# Patient Record
Sex: Male | Born: 1975 | Race: White | Hispanic: No | Marital: Married | Smoking: Never smoker
Health system: Southern US, Community
[De-identification: ages and names within clinical notes are randomized; demographics above are authoritative.]

## PROBLEM LIST (undated history)

## (undated) DIAGNOSIS — F429 Obsessive-compulsive disorder, unspecified: Secondary | ICD-10-CM

## (undated) DIAGNOSIS — F32A Depression, unspecified: Secondary | ICD-10-CM

## (undated) DIAGNOSIS — F329 Major depressive disorder, single episode, unspecified: Secondary | ICD-10-CM

## (undated) HISTORY — PX: CERVICAL FUSION: SHX112

---

## 2011-04-23 DIAGNOSIS — IMO0001 Reserved for inherently not codable concepts without codable children: Secondary | ICD-10-CM | POA: Insufficient documentation

## 2011-04-23 DIAGNOSIS — F411 Generalized anxiety disorder: Secondary | ICD-10-CM | POA: Insufficient documentation

## 2011-04-23 DIAGNOSIS — F339 Major depressive disorder, recurrent, unspecified: Secondary | ICD-10-CM | POA: Insufficient documentation

## 2014-01-14 ENCOUNTER — Emergency Department (HOSPITAL_COMMUNITY)
Admission: EM | Admit: 2014-01-14 | Discharge: 2014-01-14 | Disposition: A | Discharge status: Home / Self Care | Payer: BC Managed Care – PPO | Attending: Emergency Medicine | Admitting: Emergency Medicine

## 2014-01-14 ENCOUNTER — Encounter (HOSPITAL_COMMUNITY): Payer: Self-pay | Admitting: Emergency Medicine

## 2014-01-14 DIAGNOSIS — R112 Nausea with vomiting, unspecified: Secondary | ICD-10-CM

## 2014-01-14 DIAGNOSIS — F329 Major depressive disorder, single episode, unspecified: Secondary | ICD-10-CM | POA: Insufficient documentation

## 2014-01-14 DIAGNOSIS — A088 Other specified intestinal infections: Secondary | ICD-10-CM | POA: Insufficient documentation

## 2014-01-14 DIAGNOSIS — A084 Viral intestinal infection, unspecified: Secondary | ICD-10-CM

## 2014-01-14 DIAGNOSIS — R197 Diarrhea, unspecified: Secondary | ICD-10-CM

## 2014-01-14 DIAGNOSIS — F429 Obsessive-compulsive disorder, unspecified: Secondary | ICD-10-CM | POA: Insufficient documentation

## 2014-01-14 DIAGNOSIS — F3289 Other specified depressive episodes: Secondary | ICD-10-CM | POA: Insufficient documentation

## 2014-01-14 DIAGNOSIS — Z79899 Other long term (current) drug therapy: Secondary | ICD-10-CM | POA: Insufficient documentation

## 2014-01-14 DIAGNOSIS — R61 Generalized hyperhidrosis: Secondary | ICD-10-CM | POA: Insufficient documentation

## 2014-01-14 HISTORY — DX: Major depressive disorder, single episode, unspecified: F32.9

## 2014-01-14 HISTORY — DX: Obsessive-compulsive disorder, unspecified: F42.9

## 2014-01-14 HISTORY — DX: Depression, unspecified: F32.A

## 2014-01-14 LAB — COMPREHENSIVE METABOLIC PANEL
ALBUMIN: 4 g/dL (ref 3.5–5.2)
ALT: 43 U/L (ref 0–53)
AST: 27 U/L (ref 0–37)
Alkaline Phosphatase: 59 U/L (ref 39–117)
BUN: 18 mg/dL (ref 6–23)
CO2: 24 mEq/L (ref 19–32)
CREATININE: 0.89 mg/dL (ref 0.50–1.35)
Calcium: 9 mg/dL (ref 8.4–10.5)
Chloride: 102 mEq/L (ref 96–112)
GFR calc Af Amer: >90 mL/min (ref >90)
GFR calc non Af Amer: >90 mL/min (ref >90)
Glucose, Bld: 117 mg/dL — ABNORMAL HIGH (ref 70–99)
Potassium: 3.9 mEq/L (ref 3.7–5.3)
Sodium: 141 mEq/L (ref 137–147)
TOTAL PROTEIN: 7.3 g/dL (ref 6.0–8.3)
Total Bilirubin: 0.5 mg/dL (ref 0.3–1.2)

## 2014-01-14 LAB — LIPASE, BLOOD: Lipase: 13 U/L (ref 11–59)

## 2014-01-14 LAB — CBC WITH DIFFERENTIAL/PLATELET
BASOS ABS: 0 10*3/uL (ref 0.0–0.1)
BASOS PCT: 0 % (ref 0–1)
Eosinophils Absolute: 0.2 10*3/uL (ref 0.0–0.7)
Eosinophils Relative: 3 % (ref 0–5)
HEMATOCRIT: 46.7 % (ref 39.0–52.0)
Hemoglobin: 16.2 g/dL (ref 13.0–17.0)
Lymphocytes Relative: 20 % (ref 12–46)
Lymphs Abs: 1.1 10*3/uL (ref 0.7–4.0)
MCH: 31.2 pg (ref 26.0–34.0)
MCHC: 34.7 g/dL (ref 30.0–36.0)
MCV: 90 fL (ref 78.0–100.0)
MONO ABS: 0.5 10*3/uL (ref 0.1–1.0)
Monocytes Relative: 8 % (ref 3–12)
Neutro Abs: 4 10*3/uL (ref 1.7–7.7)
Neutrophils Relative %: 69 % (ref 43–77)
Platelets: 234 10*3/uL (ref 150–400)
RBC: 5.19 MIL/uL (ref 4.22–5.81)
RDW: 12.1 % (ref 11.5–15.5)
WBC: 5.7 10*3/uL (ref 4.0–10.5)

## 2014-01-14 LAB — POC OCCULT BLOOD, ED: Fecal Occult Bld: NEGATIVE

## 2014-01-14 LAB — CLOSTRIDIUM DIFFICILE BY PCR: Toxigenic C. Difficile by PCR: NEGATIVE

## 2014-01-14 MED ORDER — SODIUM CHLORIDE 0.9 % IV BOLUS (SEPSIS)
1000.0000 mL | Freq: Once | INTRAVENOUS | Status: AC
  Administered 2014-01-14: 1000 mL via INTRAVENOUS

## 2014-01-14 MED ORDER — ONDANSETRON 8 MG PO TBDP: ORAL_TABLET | ORAL | Status: AC

## 2014-01-14 MED ORDER — ONDANSETRON HCL 4 MG/2ML IJ SOLN: 4.0000 mg | Freq: Once | INTRAMUSCULAR | Status: DC

## 2014-01-14 NOTE — ED Provider Notes (Signed)
CSN: [id number]     Arrival date & time 01/14/14  0409 History   First MD Initiated Contact with Patient 01/14/14 0431     Chief Complaint  Patient presents with  . Emesis  . Diarrhea   HPI  History provided by the patient and significant other. Patient is a 38 year old male who presents with symptoms of nausea, vomiting and diarrhea. Patient first began feeling some abdominal discomfort and bloating Sunday evening. On Monday morning he began having episodes of vomiting with watery diarrhea throughout the day. Symptoms gradually improved and by Tuesday she was feeling somewhat better. He did have a few occasional loose stools. Wednesday patient began having additional episodes of nausea vomiting and diarrhea. This continued into the evening and early this morning. He has had some increased weakness and fatigue. He is able to keep down some water and simple fluids. He denies any known sick contacts. Did recently travel to [place] but no other travel. Denies any blood in stool. No other aggravating or alleviating factors. No other associated symptoms.     Past Medical History  Diagnosis Date  . Depression   . OCD (obsessive compulsive disorder)    No past surgical history on file. No family history on file. History  Substance Use Topics  . Smoking status: Not on file  . Smokeless tobacco: Not on file  . Alcohol Use: Not on file    Review of Systems  Constitutional: Positive for diaphoresis. Negative for fever.  HENT: Negative for congestion.   Respiratory: Negative for cough and shortness of breath.   Gastrointestinal: Positive for nausea, vomiting and diarrhea. Negative for abdominal pain and blood in stool.  All other systems reviewed and are negative.     Allergies  Review of patient's allergies indicates no known allergies.  Home Medications   Current Outpatient Rx  Name  Route  Sig  Dispense  Refill  . escitalopram (LEXAPRO) 20 MG tablet   Oral   Take 60 mg by mouth  daily.         Marland Kitchen ibuprofen (ADVIL,MOTRIN) 200 MG tablet   Oral   Take 200 mg by mouth every 6 (six) hours as needed for mild pain.         Marland Kitchen LORazepam (ATIVAN) 0.5 MG tablet   Oral   Take 0.5 mg by mouth every 8 (eight) hours as needed for anxiety.         . Multiple Vitamin (MULTIVITAMIN WITH MINERALS) TABS tablet   Oral   Take 1 tablet by mouth daily.          BP 132/88  Pulse 93  Temp(Src) 98.1 F (36.7 C) (Oral)  Resp 18  SpO2 98% Physical Exam  Nursing note and vitals reviewed. Constitutional: He is oriented to person, place, and time. He appears well-developed and well-nourished. No distress.  HENT:  Head: Normocephalic.  Cardiovascular: Normal rate and regular rhythm.   Pulmonary/Chest: Effort normal and breath sounds normal. No respiratory distress.  Abdominal: Soft. Bowel sounds are normal. He exhibits no distension. There is no tenderness. There is no rebound and no guarding.  Neurological: He is alert and oriented to person, place, and time.  Skin: Skin is warm.  Psychiatric: He has a normal mood and affect. His behavior is normal.    ED Course  Procedures   COORDINATION OF CARE:  Nursing notes reviewed. Vital signs reviewed. Initial pt interview and examination performed.   Filed Vitals:   01/14/14 0419  BP: 132/88  Pulse: 93  Temp: 98.1 F (36.7 C)  TempSrc: Oral  Resp: 18  SpO2: 98%    4:30 AM patient seen and evaluated. Patient well-appearing in no acute distress. Afebrile. Does not appear severely dehydrated. Symptoms most consistent with viral GI process. Discussed work up plan including basic lab testing and patient agrees.   Lab testing unremarkable. Patient did have episodes of diarrhea. Stool collected and sent for further testing. Patient without any recent antibiotic use. No significant risk factors for C. difficile. At this time we'll recommend symptomatic treatment of his nausea vomiting and continued rest at home. Patient agrees  with plan.   Treatment plan initiated: Medications  ondansetron [facility]) injection 4 mg (not administered)  sodium chloride 0.9 % bolus 1,000 mL (0 mLs Intravenous Stopped 01/14/14 0530)    Results for orders placed during the hospital encounter of 01/14/14  CBC WITH DIFFERENTIAL      Result Value Ref Range   WBC 5.7  4.0 - 10.5 K/uL   RBC 5.19  4.22 - 5.81 MIL/uL   Hemoglobin 16.2  13.0 - 17.0 g/dL   HCT 46.7  39.0 - 52.0 %   MCV 90.0  78.0 - 100.0 fL   MCH 31.2  26.0 - 34.0 pg   MCHC 34.7  30.0 - 36.0 g/dL   RDW 12.1  11.5 - 15.5 %   Platelets 234  150 - 400 K/uL   Neutrophils Relative % 69  43 - 77 %   Neutro Abs 4.0  1.7 - 7.7 K/uL   Lymphocytes Relative 20  12 - 46 %   Lymphs Abs 1.1  0.7 - 4.0 K/uL   Monocytes Relative 8  3 - 12 %   Monocytes Absolute 0.5  0.1 - 1.0 K/uL   Eosinophils Relative 3  0 - 5 %   Eosinophils Absolute 0.2  0.0 - 0.7 K/uL   Basophils Relative 0  0 - 1 %   Basophils Absolute 0.0  0.0 - 0.1 K/uL  COMPREHENSIVE METABOLIC PANEL      Result Value Ref Range   Sodium 141  137 - 147 mEq/L   Potassium 3.9  3.7 - 5.3 mEq/L   Chloride 102  96 - 112 mEq/L   CO2 24  19 - 32 mEq/L   Glucose, Bld 117 (*) 70 - 99 mg/dL   BUN 18  6 - 23 mg/dL   Creatinine, Ser 0.89  0.50 - 1.35 mg/dL   Calcium 9.0  8.4 - 10.5 mg/dL   Total Protein 7.3  6.0 - 8.3 g/dL   Albumin 4.0  3.5 - 5.2 g/dL   AST 27  0 - 37 U/L   ALT 43  0 - 53 U/L   Alkaline Phosphatase 59  39 - 117 U/L   Total Bilirubin 0.5  0.3 - 1.2 mg/dL   GFR calc non Af Amer >90  >90 mL/min   GFR calc Af Amer >90  >90 mL/min  LIPASE, BLOOD      Result Value Ref Range   Lipase 13  11 - 59 U/L      MDM   Final diagnoses:  Viral gastroenteritis  Nausea vomiting and diarrhea        Martie Lee, PA-C 01/14/14 [phone]

## 2014-01-14 NOTE — Discharge Instructions (Signed)
Your lab testing today has not shown any signs for concerning or emergent cause of your symptoms. Continue to drink plenty of fluids to stay hydrated. Followup with a primary care provider for continued evaluation and treatment.    Viral Gastroenteritis Viral gastroenteritis is also known as stomach flu. This condition affects the stomach and intestinal tract. It can cause sudden diarrhea and vomiting. The illness typically lasts 3 to 8 days. Most people develop an immune response that eventually gets rid of the virus. While this natural response develops, the virus can make you quite ill. CAUSES  Many different viruses can cause gastroenteritis, such as rotavirus or noroviruses. You can catch one of these viruses by consuming contaminated food or water. You may also catch a virus by sharing utensils or other personal items with an infected person or by touching a contaminated surface. SYMPTOMS  The most common symptoms are diarrhea and vomiting. These problems can cause a severe loss of body fluids (dehydration) and a body salt (electrolyte) imbalance. Other symptoms may include:  Fever.  Headache.  Fatigue.  Abdominal pain. DIAGNOSIS  Your caregiver can usually diagnose viral gastroenteritis based on your symptoms and a physical exam. A stool sample may also be taken to test for the presence of viruses or other infections. TREATMENT  This illness typically goes away on its own. Treatments are aimed at rehydration. The most serious cases of viral gastroenteritis involve vomiting so severely that you are not able to keep fluids down. In these cases, fluids must be given through an intravenous line (IV). HOME CARE INSTRUCTIONS   Drink enough fluids to keep your urine clear or pale yellow. Drink small amounts of fluids frequently and increase the amounts as tolerated.  Ask your caregiver for specific rehydration instructions.  Avoid:  Foods high in sugar.  Alcohol.  Carbonated  drinks.  Tobacco.  Juice.  Caffeine drinks.  Extremely hot or cold fluids.  Fatty, greasy foods.  Too much intake of anything at one time.  Dairy products until 24 to 48 hours after diarrhea stops.  You may consume probiotics. Probiotics are active cultures of beneficial bacteria. They may lessen the amount and number of diarrheal stools in adults. Probiotics can be found in yogurt with active cultures and in supplements.  Wash your hands well to avoid spreading the virus.  Only take over-the-counter or prescription medicines for pain, discomfort, or fever as directed by your caregiver. Do not give aspirin to children. Antidiarrheal medicines are not recommended.  Ask your caregiver if you should continue to take your regular prescribed and over-the-counter medicines.  Keep all follow-up appointments as directed by your caregiver. SEEK IMMEDIATE MEDICAL CARE IF:   You are unable to keep fluids down.  You do not urinate at least once every 6 to 8 hours.  You develop shortness of breath.  You notice blood in your stool or vomit. This may look like coffee grounds.  You have abdominal pain that increases or is concentrated in one small area (localized).  You have persistent vomiting or diarrhea.  You have a fever.  The patient is a child younger than 3 months, and he or she has a fever.  The patient is a child older than 3 months, and he or she has a fever and persistent symptoms.  The patient is a child older than 3 months, and he or she has a fever and symptoms suddenly get worse.  The patient is a baby, and he or she has no  tears when crying. MAKE SURE YOU:   Understand these instructions.  Will watch your condition.  Will get help right away if you are not doing well or get worse. Document Released: 09/24/2005 Document Revised: 12/17/2011 Document Reviewed: 07/11/2011 [facility] Patient Information 2014 [place].

## 2014-01-14 NOTE — ED Provider Notes (Signed)
Medical screening examination/treatment/procedure(s) were performed by non-physician practitioner and as supervising physician I was immediately available for consultation/collaboration.   EKG Interpretation None       Kalman Drape, MD 01/14/14 [phone]

## 2014-01-14 NOTE — ED Notes (Signed)
Pt reports n/v/d onset 3-4 days, pt states n/v has subsided, diarrhea, yellow with odor about every 5 min for last few hours. Pt appears pale. Family at bedside. Zofran not given at this time, pt denies nausea. Pt and family to notify staff if he becomes nauseated.

## 2014-01-14 NOTE — ED Notes (Signed)
Pt reports that he started to feel bloated Sunday and began having n/v/d. The symptoms eased up but returned Tuesday with another onset of bloating. Pt reports vomiting 4times and numerous episodes of diarrhea. Pt denies any fevers. Pt alert and ambulatory. Family in room.

## 2014-01-14 NOTE — ED Notes (Signed)
Stool culture obtained, watery light brown, fowl odor

## 2014-01-18 LAB — STOOL CULTURE

## 2017-11-16 ENCOUNTER — Emergency Department (HOSPITAL_COMMUNITY)
Admission: EM | Admit: 2017-11-16 | Discharge: 2017-11-16 | Disposition: A | Discharge status: Home / Self Care | Payer: BLUE CROSS/BLUE SHIELD | Attending: Emergency Medicine | Admitting: Emergency Medicine

## 2017-11-16 ENCOUNTER — Emergency Department (HOSPITAL_COMMUNITY): Payer: BLUE CROSS/BLUE SHIELD

## 2017-11-16 ENCOUNTER — Other Ambulatory Visit: Payer: Self-pay

## 2017-11-16 ENCOUNTER — Encounter (HOSPITAL_COMMUNITY): Payer: Self-pay

## 2017-11-16 DIAGNOSIS — Y929 Unspecified place or not applicable: Secondary | ICD-10-CM | POA: Insufficient documentation

## 2017-11-16 DIAGNOSIS — Z79899 Other long term (current) drug therapy: Secondary | ICD-10-CM | POA: Diagnosis not present

## 2017-11-16 DIAGNOSIS — W228XXA Striking against or struck by other objects, initial encounter: Secondary | ICD-10-CM | POA: Diagnosis not present

## 2017-11-16 DIAGNOSIS — Y9389 Activity, other specified: Secondary | ICD-10-CM | POA: Insufficient documentation

## 2017-11-16 DIAGNOSIS — M7041 Prepatellar bursitis, right knee: Secondary | ICD-10-CM

## 2017-11-16 DIAGNOSIS — Y999 Unspecified external cause status: Secondary | ICD-10-CM | POA: Diagnosis not present

## 2017-11-16 DIAGNOSIS — S8011XA Contusion of right lower leg, initial encounter: Secondary | ICD-10-CM | POA: Insufficient documentation

## 2017-11-16 DIAGNOSIS — S8991XA Unspecified injury of right lower leg, initial encounter: Secondary | ICD-10-CM | POA: Diagnosis present

## 2017-11-16 NOTE — ED Triage Notes (Signed)
Pt presents to the ed with complaints of injuring his right leg. States he was cutting down a tree limb with part of the tree on the ground.  The tree somehow swung and hit his right thigh and knocked the pt up in the air about 2 feet and he then landed on his right side.  Pt was wearing a helmet denies any head injury or LOC.  Reports feeling anxious.  Ambulatory but states he has to limp due to pain in his right thigh. Alert and oriented.

## 2017-11-16 NOTE — ED Provider Notes (Signed)
[facility] EMERGENCY DEPARTMENT Provider Note   CSN: [id number] Arrival date & time: 11/16/17  1232     History   Chief Complaint Chief Complaint  Patient presents with  . Leg Injury    HPI Phillip Griffin is a 42 y.o. male.  HPI   42 year old male presents status post leg injury.  Patient notes that he was cutting a long limb off of a tree that was struck in the ground, as he was cutting limbs off the distal end of it the limb swelling out struck him in the right lateral thigh and threw him into the air.  Patient reports he was wearing a hard hat, denies any loss of consciousness or headache, denies any neurological deficits.  He reports some nausea.  He notes an achy pain to the right lateral thigh worse with movement, denies any other injuries including chest pain, shortness of breath, abdominal pain, neck or back pain.  She also notes chronic swelling over the right knee, he notes this is only on the front, surrounding swelling, redness, warmth to touch or fever, no decreased range of motion  Past Medical History:  Diagnosis Date  . Depression   . OCD (obsessive compulsive disorder)     There are no active problems to display for this patient.   Past Surgical History:  Procedure Laterality Date  . CERVICAL FUSION         Home Medications    Prior to Admission medications   Medication Sig Start Date End Date Taking? Authorizing Provider  escitalopram (LEXAPRO) 20 MG tablet Take 60 mg by mouth daily.    [provider]  ibuprofen (ADVIL,MOTRIN) 200 MG tablet Take 200 mg by mouth every 6 (six) hours as needed for mild pain.    [provider]  LORazepam (ATIVAN) 0.5 MG tablet Take 0.5 mg by mouth every 8 (eight) hours as needed for anxiety.    [provider]  Multiple Vitamin (MULTIVITAMIN WITH MINERALS) TABS tablet Take 1 tablet by mouth daily.    [provider]  ondansetron (ZOFRAN ODT) 8 MG  disintegrating tablet 25m ODT q4 hours prn nausea 01/14/14   DHazel Sams PA-C    Family History No family history on file.  Social History Social History   Tobacco Use  . Smoking status: Never Smoker  . Smokeless tobacco: Never Used  Substance Use Topics  . Alcohol use: Yes    Frequency: Never    Comment: weekly  . Drug use: No     Allergies   Patient has no known allergies.   Review of Systems Review of Systems  All other systems reviewed and are negative.    Physical Exam Updated Vital Signs BP (!) 131/93   Pulse 93   Temp 97.8 F (36.6 C)   Resp 18   Wt 90.3 kg (199 lb)   SpO2 99%   Physical Exam  Constitutional: He is oriented to person, place, and time. He appears well-developed and well-nourished.  HENT:  Head: Normocephalic and atraumatic.  Eyes: Conjunctivae are normal. Pupils are equal, round, and reactive to light. Right eye exhibits no discharge. Left eye exhibits no discharge. No scleral icterus.  Neck: Normal range of motion. No JVD present. No tracheal deviation present.  Pulmonary/Chest: Effort normal. No stridor.  Musculoskeletal:  No CT or L-spine tenderness palpation  Right lateral thigh with superficial abrasion no significant swelling or edema pain with palpation and extension of the knee; knee extension is intact  with 5 out of 5 strength, no distal swelling or edema, pedal pulse 2+, sensation intact-right hip full active range of motion pain-free  Neurological: He is alert and oriented to person, place, and time. No cranial nerve deficit or sensory deficit. He exhibits normal muscle tone. Coordination normal.  Psychiatric: He has a normal mood and affect. His behavior is normal. Judgment and thought content normal.  Nursing note and vitals reviewed.    ED Treatments / Results  Labs (all labs ordered are listed, but only abnormal results are displayed) Labs Reviewed - No data to display  EKG  EKG Interpretation None        Radiology Dg Femur Min 2 Views Right  Result Date: 11/16/2017 CLINICAL DATA:  Struck by tree limb distal femur EXAM: RIGHT FEMUR 2 VIEWS COMPARISON:  None. FINDINGS: There is no evidence of fracture or other focal bone lesions. Soft tissues are unremarkable. IMPRESSION: Negative. Electronically Signed   By: Rolm Baptise M.D.   On: 11/16/2017 13:32    Procedures Procedures (including critical care time)  Medications Ordered in ED Medications - No data to display   Initial Impression / Assessment and Plan / ED Course  I have reviewed the triage vital signs and the nursing notes.  Pertinent labs & imaging results that were available during my care of the patient were reviewed by me and considered in my medical decision making (see chart for details).      Final Clinical Impressions(s) / ED Diagnoses   Final diagnoses:  Contusion of right lower extremity, initial encounter  Prepatellar bursitis of right knee    42 year old male presents today with contusion to his right lateral thigh.  Plain films show no acute bony abnormality, he has no significant swelling or edema no signs of compartment syndrome, no distal neurological deficits no other injuries noted.  The patient without any acute trauma to the head, no neurological deficits.  Symptom medic care instructions given, strict return precautions given.  He verbalized understanding and agreement to today's plan had no further questions  ED Discharge Orders    None       Okey Regal, Hershal Coria 11/16/17 1415    Mabe, Forbes Cellar, MD 11/16/17 1421

## 2017-11-16 NOTE — Discharge Instructions (Signed)
Please read attached information. If you experience any new or worsening signs or symptoms please return to the emergency room for evaluation. Please follow-up with your primary care provider or specialist as discussed. Pleas use tylenol as needed for pain.

## 2017-11-28 ENCOUNTER — Ambulatory Visit (INDEPENDENT_AMBULATORY_CARE_PROVIDER_SITE_OTHER): Payer: BLUE CROSS/BLUE SHIELD | Admitting: Orthopedic Surgery

## 2017-11-28 ENCOUNTER — Encounter (INDEPENDENT_AMBULATORY_CARE_PROVIDER_SITE_OTHER): Payer: Self-pay | Admitting: Orthopedic Surgery

## 2017-11-28 ENCOUNTER — Ambulatory Visit (INDEPENDENT_AMBULATORY_CARE_PROVIDER_SITE_OTHER): Payer: BLUE CROSS/BLUE SHIELD

## 2017-11-28 VITALS — Wt 199.0 lb

## 2017-11-28 DIAGNOSIS — G8929 Other chronic pain: Secondary | ICD-10-CM

## 2017-11-28 DIAGNOSIS — M7041 Prepatellar bursitis, right knee: Secondary | ICD-10-CM

## 2017-11-28 DIAGNOSIS — M25561 Pain in right knee: Secondary | ICD-10-CM

## 2017-11-28 DIAGNOSIS — S8001XA Contusion of right knee, initial encounter: Secondary | ICD-10-CM

## 2017-11-28 NOTE — Progress Notes (Signed)
Office Visit Note   Patient: Phillip Griffin           Date of Birth: Aug 19, 1976           MRN: [id number] Visit Date: 11/28/2017              Requested by: No referring provider defined for this encounter. PCP: Patient, No Pcp Per  Chief Complaint  Patient presents with  . Right Knee - Pain      HPI: Patient is a 42 year old gentleman who presents for initial evaluation for right knee injury.  Patient states that he was working on a tree limb when the tree limb struck him on the lateral aspect of his right thigh he states that this caused his legs to go in the air and he fell.  Patient also states he has had chronic on and off prepatellar bursitis of the right knee.  Assessment & Plan: Visit Diagnoses:  1. Chronic pain of right knee   2. Prepatellar bursitis of right knee   3. Contusion of right knee, initial encounter     Plan: Recommended quad VMO strengthening.  Recommended compression for the prepatellar bursal swelling.  Discussed that if he has persistent swelling that excision of the prepatellar bursa is an option.  Patient has no restrictions at this time.  Follow-Up Instructions: Return if symptoms worsen or fail to improve.   Ortho Exam  Patient is alert, oriented, no adenopathy, well-dressed, normal affect, normal respiratory effort. Examination patient has a normal gait.  He has full range of motion of the right knee collaterals and cruciates are stable he has no tenderness to palpation over the MCL or LCL.  He has some mild tenderness to palpation of the medial and lateral joint line anterior drawer is stable with no ACL injury.  There is no effusion of the knee he does have chronic patella bursal swelling but there is no effusion no redness.  He has full active extension and flexion.  There is no ecchymosis or bruising around the knee.  Imaging: Xr Knee 1-2 Views Right  Result Date: 11/28/2017 2 view radiographs of the right knee shows some swelling over  the prepatellar bursal area.  The joint space is congruent there is no malalignment no joint space narrowing no signs of fracture.  No images are attached to the encounter.  Labs: Lab Results  Component Value Date   REPTSTATUS 01/18/2014 FINAL 01/14/2014   CULT  01/14/2014    NO SALMONELLA, SHIGELLA, CAMPYLOBACTER, YERSINIA, OR E.COLI [id number] ISOLATED Performed at [facility]    @[email or web address] ([email or web address])@  There is no height or weight on file to calculate BMI.  Orders:  Orders Placed This Encounter  Procedures  . XR Knee 1-2 Views Right   No orders of the defined types were placed in this encounter.    Procedures: No procedures performed  Clinical Data: No additional findings.  ROS:  All other systems negative, except as noted in the HPI. Review of Systems  Objective: Vital Signs: Wt 199 lb (90.3 kg)   Specialty Comments:  No specialty comments available.  PMFS History: There are no active problems to display for this patient.  Past Medical History:  Diagnosis Date  . Depression   . OCD (obsessive compulsive disorder)     No family history on file.  Past Surgical History:  Procedure Laterality Date  . CERVICAL FUSION     Social History   Occupational History  . Not on file  Tobacco  Use  . Smoking status: Never Smoker  . Smokeless tobacco: Never Used  Substance and Sexual Activity  . Alcohol use: Yes    Frequency: Never    Comment: weekly  . Drug use: No  . Sexual activity: Not on file

## 2019-04-09 ENCOUNTER — Telehealth: Payer: Self-pay

## 2019-04-09 DIAGNOSIS — Z20822 Contact with and (suspected) exposure to covid-19: Secondary | ICD-10-CM

## 2019-04-09 NOTE — Telephone Encounter (Signed)
Lattie Haw, Glass blower/designer at Dr. Benjamine Mola Dewey's office called to request covid testing for the patient. I called the patient and advised of the request, he verbalized understanding. Appointment scheduled for Monday, 04/13/19 at [place] at [facility], advised of location and to wear a mask for everyone in the vehicle, he verbalized understanding. Order placed. Assisted patient to set up MyChart, generated code via text message, he verbalized receipt, advised to follow the instructions in the link.

## 2019-04-13 ENCOUNTER — Other Ambulatory Visit: Payer: BLUE CROSS/BLUE SHIELD

## 2019-04-13 DIAGNOSIS — Z20822 Contact with and (suspected) exposure to covid-19: Secondary | ICD-10-CM

## 2019-04-18 LAB — NOVEL CORONAVIRUS, NAA: SARS-CoV-2, NAA: NOT DETECTED

## 2019-05-05 ENCOUNTER — Other Ambulatory Visit: Payer: Self-pay | Admitting: Family Medicine

## 2019-05-05 DIAGNOSIS — K5792 Diverticulitis of intestine, part unspecified, without perforation or abscess without bleeding: Secondary | ICD-10-CM

## 2019-05-06 ENCOUNTER — Ambulatory Visit
Admission: RE | Admit: 2019-05-06 | Discharge: 2019-05-06 | Disposition: A | Discharge status: Home / Self Care | Payer: BC Managed Care – PPO | Source: Ambulatory Visit | Attending: Family Medicine | Admitting: Family Medicine

## 2019-05-06 DIAGNOSIS — K5792 Diverticulitis of intestine, part unspecified, without perforation or abscess without bleeding: Secondary | ICD-10-CM

## 2019-05-06 MED ORDER — IOPAMIDOL (ISOVUE-300) INJECTION 61%
100.0000 mL | Freq: Once | INTRAVENOUS | Status: AC | PRN
  Administered 2019-05-06: 100 mL via INTRAVENOUS

## 2019-05-14 ENCOUNTER — Other Ambulatory Visit: Payer: BLUE CROSS/BLUE SHIELD

## 2020-05-31 ENCOUNTER — Other Ambulatory Visit: Payer: Self-pay

## 2020-05-31 ENCOUNTER — Other Ambulatory Visit: Payer: Self-pay | Admitting: Family Medicine

## 2020-05-31 ENCOUNTER — Ambulatory Visit
Admission: RE | Admit: 2020-05-31 | Discharge: 2020-05-31 | Disposition: A | Discharge status: Home / Self Care | Payer: Self-pay | Source: Ambulatory Visit | Attending: Family Medicine | Admitting: Family Medicine

## 2020-05-31 DIAGNOSIS — G588 Other specified mononeuropathies: Secondary | ICD-10-CM

## 2020-06-15 ENCOUNTER — Encounter: Payer: Self-pay | Admitting: Neurology

## 2020-06-28 ENCOUNTER — Other Ambulatory Visit: Payer: Self-pay | Admitting: Family Medicine

## 2020-06-28 DIAGNOSIS — N509 Disorder of male genital organs, unspecified: Secondary | ICD-10-CM

## 2020-07-04 ENCOUNTER — Ambulatory Visit
Admission: RE | Admit: 2020-07-04 | Discharge: 2020-07-04 | Disposition: A | Discharge status: Home / Self Care | Payer: BC Managed Care – PPO | Source: Ambulatory Visit | Attending: Family Medicine | Admitting: Family Medicine

## 2020-07-04 DIAGNOSIS — N509 Disorder of male genital organs, unspecified: Secondary | ICD-10-CM

## 2020-08-30 NOTE — Progress Notes (Signed)
NEUROLOGY CONSULTATION NOTE  Phillip Griffin MRN: [id number] DOB: Oct 22, 1975  Referring provider: Horald Pollen, MD Primary care provider: Horald Pollen, MD  Reason for consult:  Pudendal neuralgia   Subjective:  Phillip Griffin is a 44 year old right-handed male who presents for pudendal neuralgia.  History supplemented by referring provider's note.  Started one year ago.  Marland Kitchen  No preceding trauma.  Developed pain upon standing from prolonged sitting.  While sitting, he feels fine, but it occurs when he stands up.  He feels the pain in the upper inner thigh bilaterally into the groin and involving the sacral area.  It is a severe intense sharp and shooting pain.  He needs to walk it out.  No numbness, however if he has been sitting back in a chair, he may feel numbness in the perineal region and penis.  No difficulty with urinating, having a bowel movement or obtaining an erection.  No low back pain.  Sometimes he has sciatic pain for many years, initially left sided but now right sided.  He noticed swelling on his right side of his scrotum back in September and was started on antibiotics for presumed epididymitis. He was started on gabapentin which did not seem effective.  He is now taking meloxicam, which has been helpful.     X-ray of sacrum/coccyx on 05/31/2020 personally reviewed was normal.  Ultrasound of his scrotum on 07/04/2020 demonstrated 13 mm mass at the left epididymal head, either epididymal cyst vs spermatocele, as well as trace left hydrocele but otherwise unremarkable.      He saw a urologist who couldn't find a cause but suggested pelvic floor physical therapy.  In 2005, he had a 12 foot fall in which he sustained low back injury and the sciatica started afterwards.   He has an extensive history of depression, requiring multiple changes in medication management.  Current medication with Lexapro is effective and would like to avoid any medication changes that may  alter his current management.  PAST MEDICAL HISTORY: Past Medical History:  Diagnosis Date  . Depression   . OCD (obsessive compulsive disorder)     PAST SURGICAL HISTORY: Past Surgical History:  Procedure Laterality Date  . CERVICAL FUSION      MEDICATIONS: Current Outpatient Medications on File Prior to Visit  Medication Sig Dispense Refill  . escitalopram (LEXAPRO) 20 MG tablet Take 60 mg by mouth daily.    Marland Kitchen ibuprofen (ADVIL,MOTRIN) 200 MG tablet Take 200 mg by mouth every 6 (six) hours as needed for mild pain.    Marland Kitchen LORazepam (ATIVAN) 0.5 MG tablet Take 0.5 mg by mouth every 8 (eight) hours as needed for anxiety.    . Multiple Vitamin (MULTIVITAMIN WITH MINERALS) TABS tablet Take 1 tablet by mouth daily.    . ondansetron (ZOFRAN ODT) 8 MG disintegrating tablet 12m ODT q4 hours prn nausea 20 tablet 0   No current facility-administered medications on file prior to visit.    ALLERGIES: No Known Allergies  FAMILY HISTORY: No family history on file.   SOCIAL HISTORY: Social History   Socioeconomic History  . Marital status: Married    Spouse name: Not on file  . Number of children: Not on file  . Years of education: Not on file  . Highest education level: Not on file  Occupational History  . Not on file  Tobacco Use  . Smoking status: Never Smoker  . Smokeless tobacco: Never Used  Substance and Sexual Activity  . Alcohol use:  Yes    Comment: weekly  . Drug use: No  . Sexual activity: Not on file  Other Topics Concern  . Not on file  Social History Narrative  . Not on file   Social Determinants of Health   Financial Resource Strain:   . Difficulty of Paying Living Expenses: Not on file  Food Insecurity:   . Worried About Charity fundraiser in the Last Year: Not on file  . Ran Out of Food in the Last Year: Not on file  Transportation Needs:   . Lack of Transportation (Medical): Not on file  . Lack of Transportation (Non-Medical): Not on file  Physical  Activity:   . Days of Exercise per Week: Not on file  . Minutes of Exercise per Session: Not on file  Stress:   . Feeling of Stress : Not on file  Social Connections:   . Frequency of Communication with Friends and Family: Not on file  . Frequency of Social Gatherings with Friends and Family: Not on file  . Attends Religious Services: Not on file  . Active Member of Clubs or Organizations: Not on file  . Attends Archivist Meetings: Not on file  . Marital Status: Not on file  Intimate Partner Violence:   . Fear of Current or Ex-Partner: Not on file  . Emotionally Abused: Not on file  . Physically Abused: Not on file  . Sexually Abused: Not on file    Objective:  Blood pressure 130/83, pulse 86, height 6' (1.829 m), weight 207 lb (93.9 kg), SpO2 98 %. General: No acute distress.  Patient appears well-groomed.   Head:  Normocephalic/atraumatic Eyes:  fundi examined but not visualized Neck: supple, no paraspinal tenderness, full range of motion Back: No paraspinal tenderness Heart: regular rate and rhythm Lungs: Clear to auscultation bilaterally. Vascular: No carotid bruits. Neurological Exam: Mental status: alert and oriented to person, place, and time, recent and remote memory intact, fund of knowledge intact, attention and concentration intact, speech fluent and not dysarthric, language intact. Cranial nerves: CN I: not tested CN II: pupils equal, round and reactive to light, visual fields intact CN III, IV, VI:  full range of motion, no nystagmus, no ptosis CN V: facial sensation intact. CN VII: upper and lower face symmetric CN VIII: hearing intact CN IX, X: gag intact, uvula midline CN XI: sternocleidomastoid and trapezius muscles intact CN XII: tongue midline Bulk & Tone: normal, no fasciculations. Motor:  muscle strength 5/5 throughout Sensation:  Pinprick, temperature and vibratory sensation intact. Deep Tendon Reflexes:  2+ throughout,  toes downgoing.     Finger to nose testing:  Without dysmetria.   Heel to shin:  Without dysmetria.   Gait:  Normal station and stride.  Romberg negative.  Assessment/Plan:   Pudendal neuralgia, bilateral.  At this time, he defers change in medication management until further workup is completed.  1.  MRI of lumbar spine with and without contrast. 2.  Continue meloxicam for now.  Consider restarting gabapentin (may need to titrate dose higher) or starting Lyrica.  Due to complicated history of depression, would not change or add another antidepressant such as amitriptyline/nortripytline or Cymbalta.  May also consider referral to interventional pain specialist for nerve block. 3.  Follow up after testing.    Thank you for allowing me to take part in the care of this patient.  Metta Clines, DO  CC: Horald Pollen, MD

## 2020-09-05 ENCOUNTER — Other Ambulatory Visit: Payer: Self-pay

## 2020-09-05 ENCOUNTER — Ambulatory Visit (INDEPENDENT_AMBULATORY_CARE_PROVIDER_SITE_OTHER): Payer: BC Managed Care – PPO | Admitting: Neurology

## 2020-09-05 ENCOUNTER — Encounter: Payer: Self-pay | Admitting: Neurology

## 2020-09-05 VITALS — BP 130/83 | HR 86 | Ht 72.0 in | Wt 207.0 lb

## 2020-09-05 DIAGNOSIS — G588 Other specified mononeuropathies: Secondary | ICD-10-CM

## 2020-09-05 NOTE — Patient Instructions (Signed)
1.  We will check MRI of lumbar spine with and without contrast 2.  Further recommendations pending results. 3.  Continue meloxicam for now.

## 2020-09-21 ENCOUNTER — Ambulatory Visit
Admission: RE | Admit: 2020-09-21 | Discharge: 2020-09-21 | Disposition: A | Discharge status: Home / Self Care | Payer: BC Managed Care – PPO | Source: Ambulatory Visit | Attending: Neurology | Admitting: Neurology

## 2020-09-21 DIAGNOSIS — G588 Other specified mononeuropathies: Secondary | ICD-10-CM

## 2020-09-21 MED ORDER — GADOBENATE DIMEGLUMINE 529 MG/ML IV SOLN
20.0000 mL | Freq: Once | INTRAVENOUS | Status: AC | PRN
  Administered 2020-09-21: 20 mL via INTRAVENOUS

## 2020-09-23 ENCOUNTER — Telehealth: Payer: Self-pay

## 2020-09-23 NOTE — Progress Notes (Signed)
Per pt to let his pcp know the results of his Mri. Pt advised of Results and Dr.Jaffe recommendations.

## 2020-09-23 NOTE — Telephone Encounter (Signed)
-----   Message from Phillip Partridge, DO sent at 09/23/2020  7:39 AM EST ----- MRI of lumbar spine shows significant arthritis of the joints in his spine.  This may be the source of his pain.  We can refer him to ortho spine for any possible treatment or he may follow up with his PCP to decide the best course of action.

## 2020-09-23 NOTE — Telephone Encounter (Signed)
Pt advised of his Mri Results. Pt will f/u with his pcp.   Pt would like to have a copy of his results faxed to his pcp office.

## 2020-11-14 IMAGING — CR DG SACRUM/COCCYX 2+V
3 series · 3 of 3 positions shown · non-contrast
Comparison: None.

CLINICAL DATA: Pudendal neuralgia.  Previous fall

EXAM:
SACRUM AND COCCYX - 2+ VIEW

[t sacrum ap]
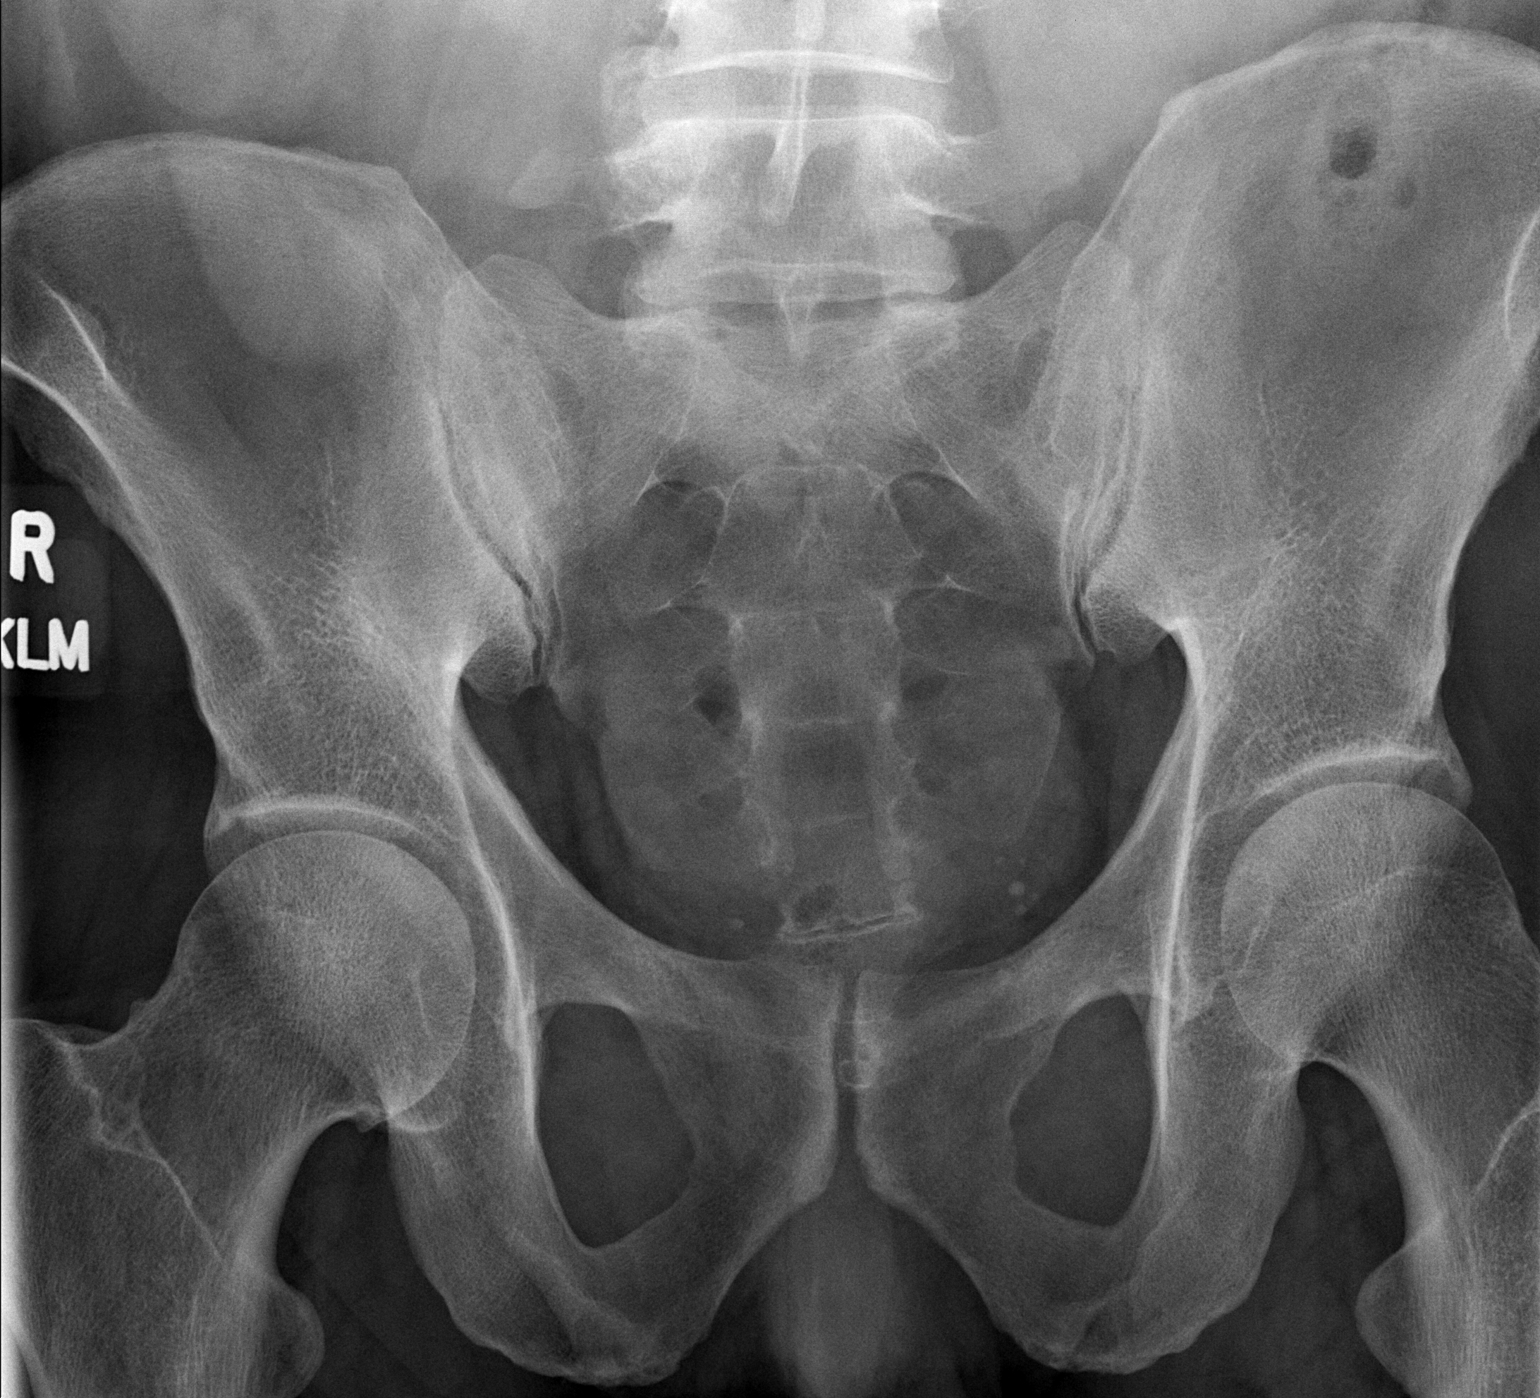

[t coccyx ap]
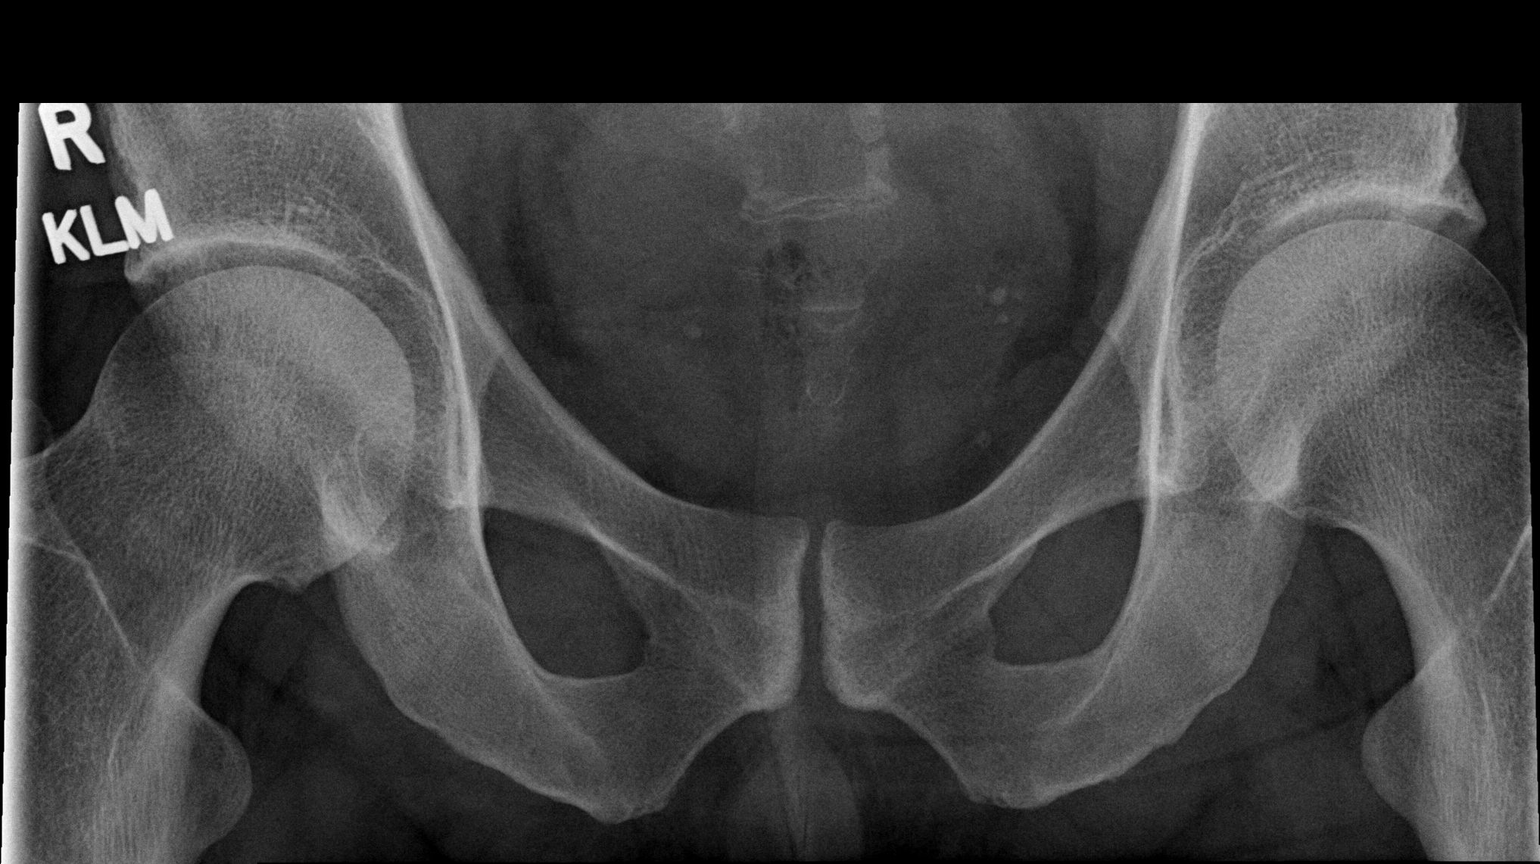

[t sacrum coccyx lat]
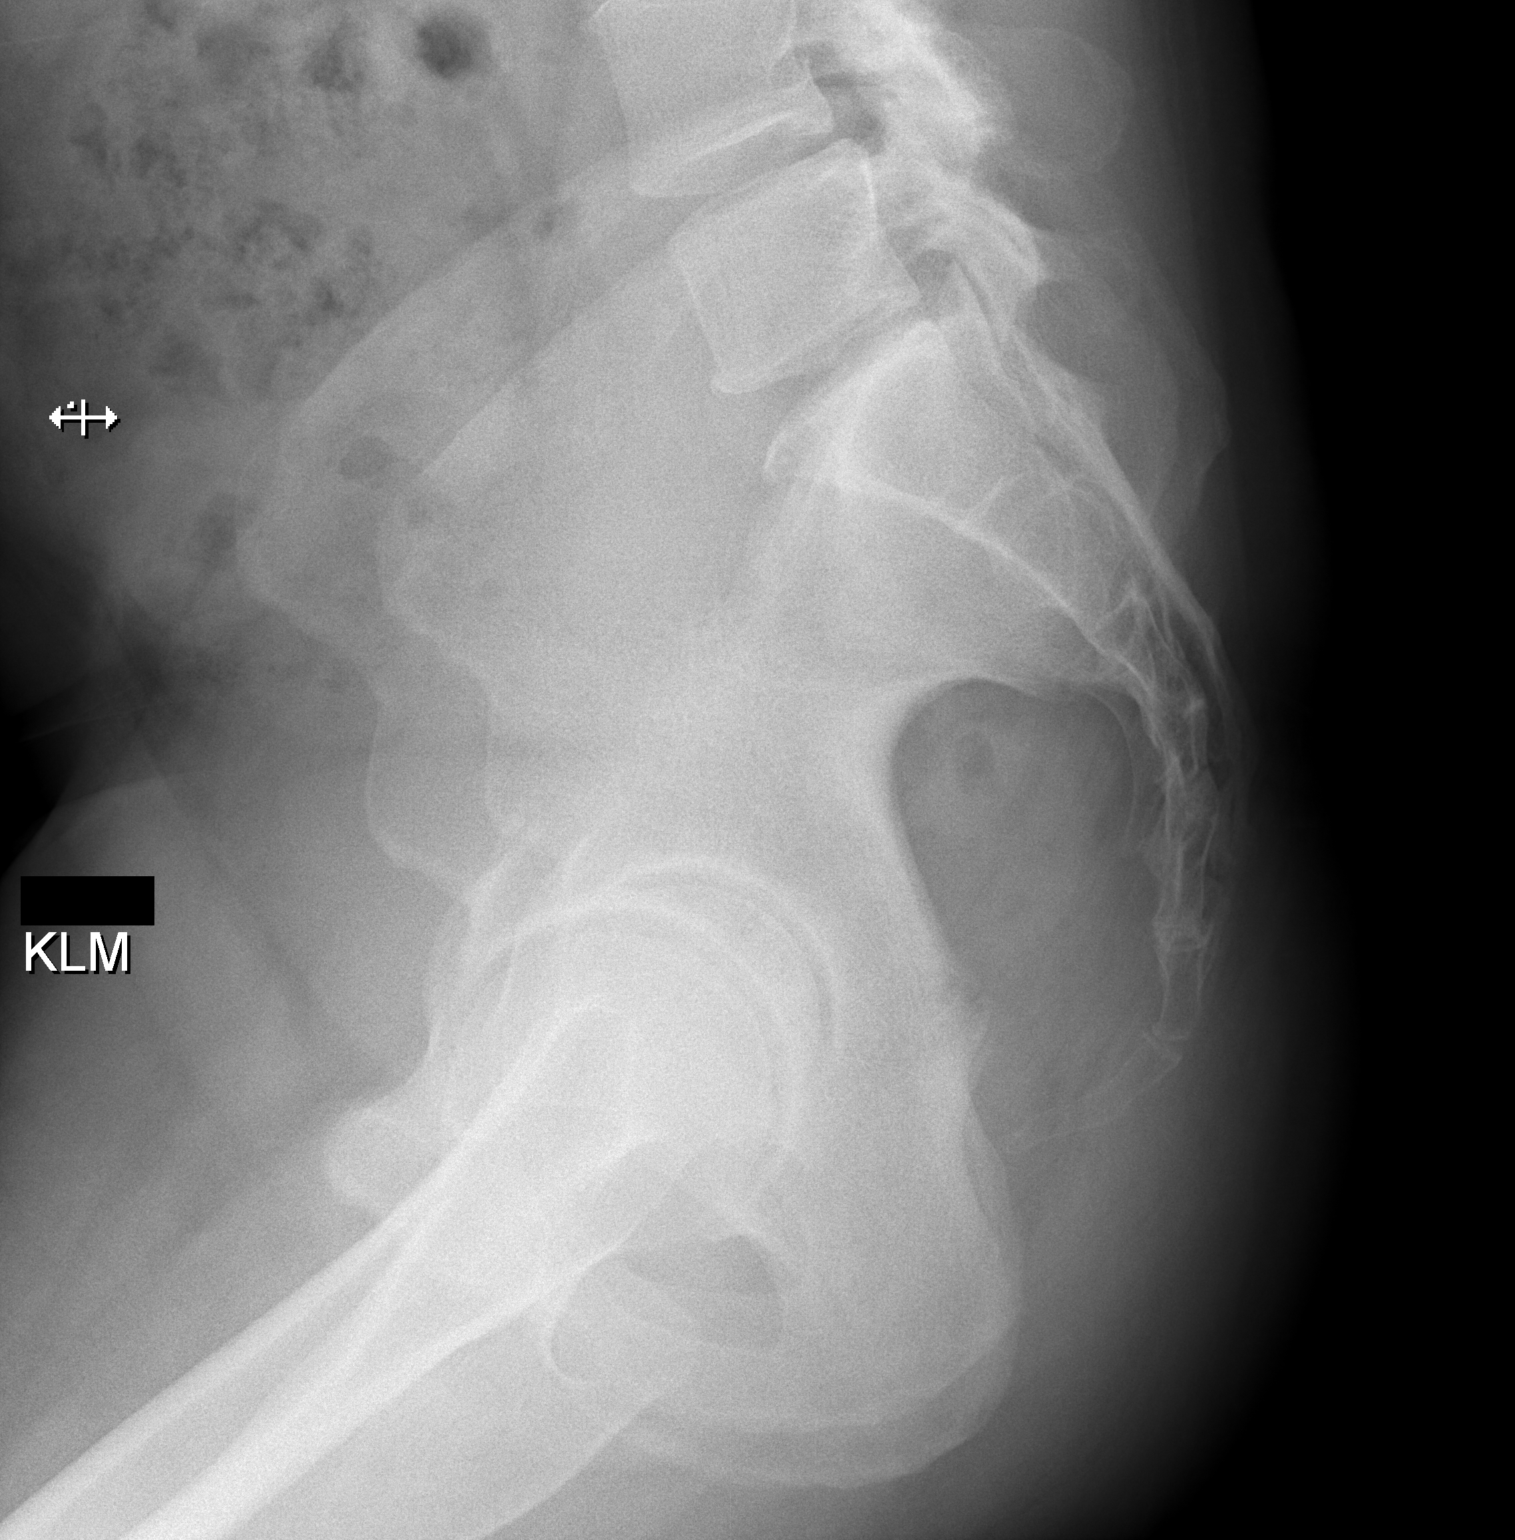

[3 of 3 positions shown; findings below may reference images not displayed]

FINDINGS: Frontal and lateral views were obtained. There is no fracture or
diastasis. Joint spaces appear normal. No evidence sacroiliitis.
IMPRESSION: No fracture or diastasis.  No evident arthropathy.

## 2020-12-18 IMAGING — US US SCROTUM W/ DOPPLER COMPLETE
1 series · 13 of 25 positions shown · non-contrast
Comparison: None

CLINICAL DATA: LEFT testicular lesion for 3 weeks

EXAM:
SCROTAL ULTRASOUND
DOPPLER ULTRASOUND OF THE TESTICLES
TECHNIQUE: Complete ultrasound examination of the testicles, epididymis, and
other scrotal structures was performed. Color and spectral Doppler
ultrasound were also utilized to evaluate blood flow to the
testicles.

[Series 1: us scrotum w/ doppler complete · 0.06mm/px · 13 of 60 slices shown]
[im 1/60]
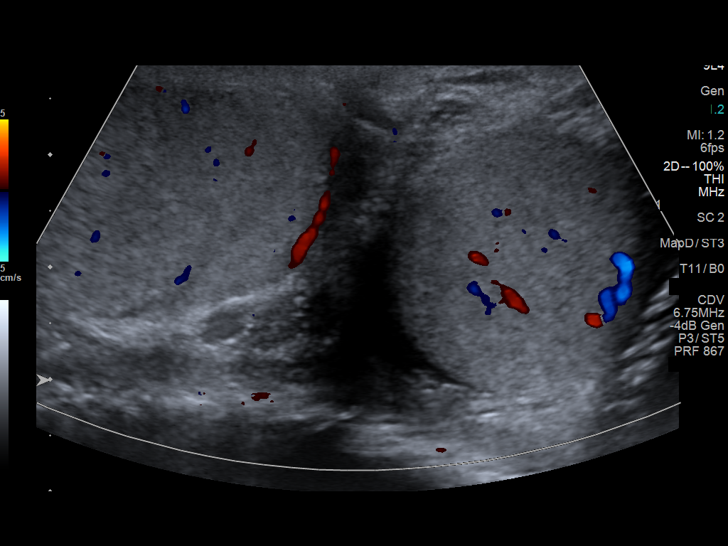
[im 5/60]
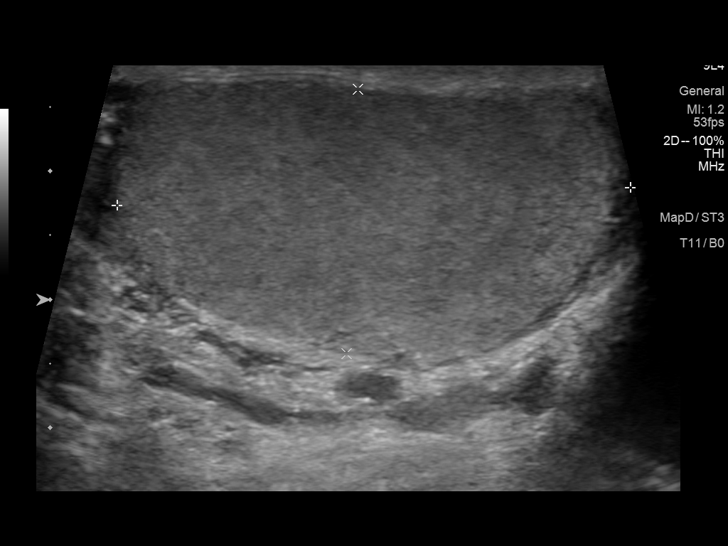
[im 10/60]
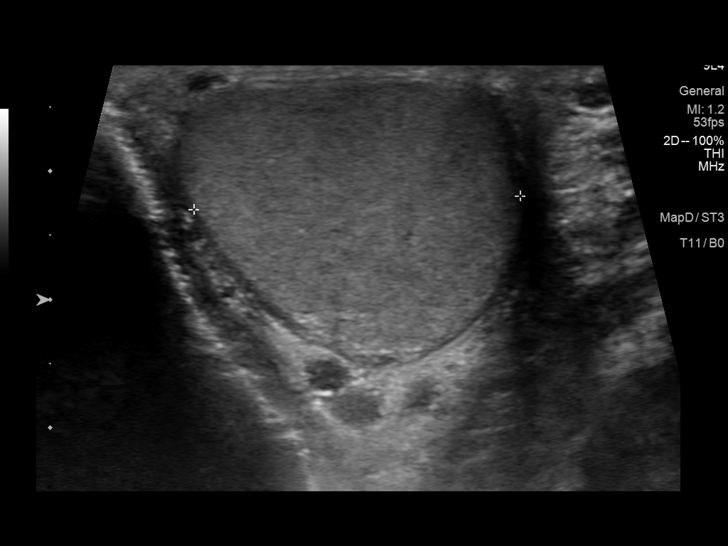
[im 15/60]
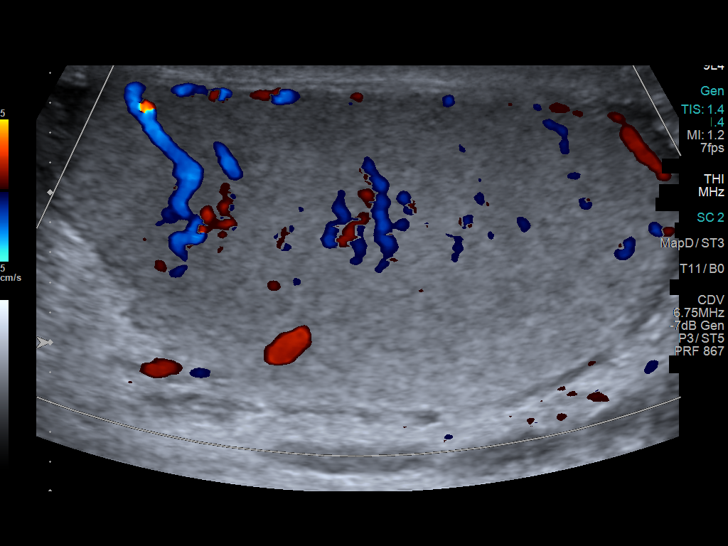
[im 20/60]
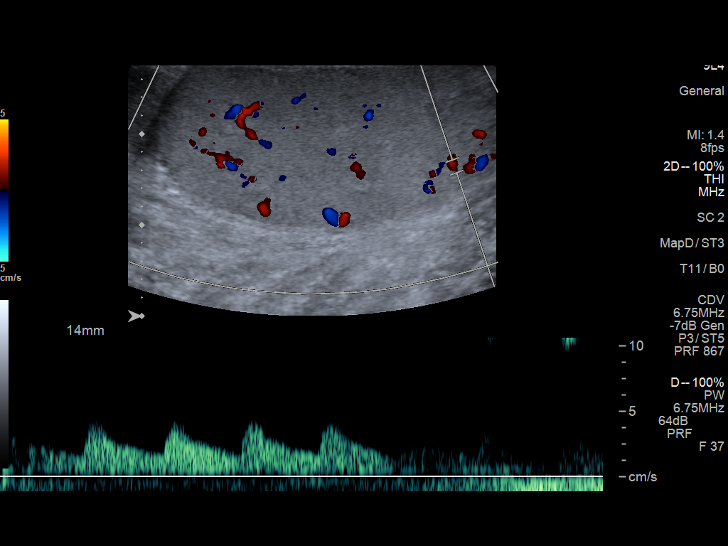
[im 25/60]
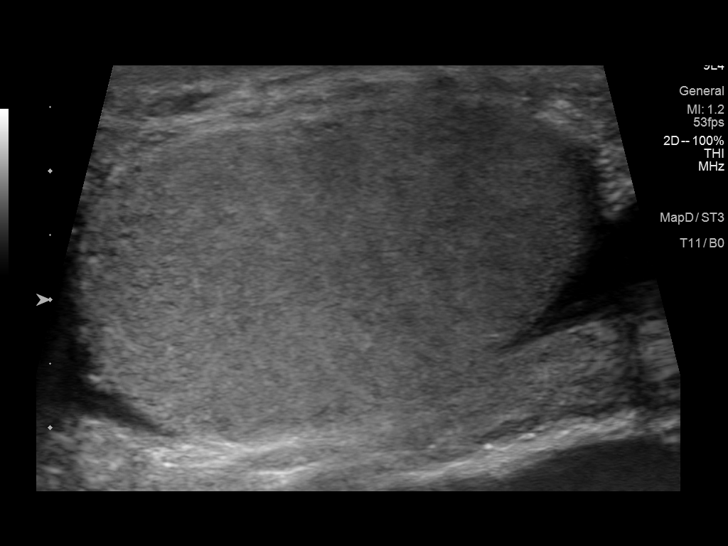
[im 30/60]
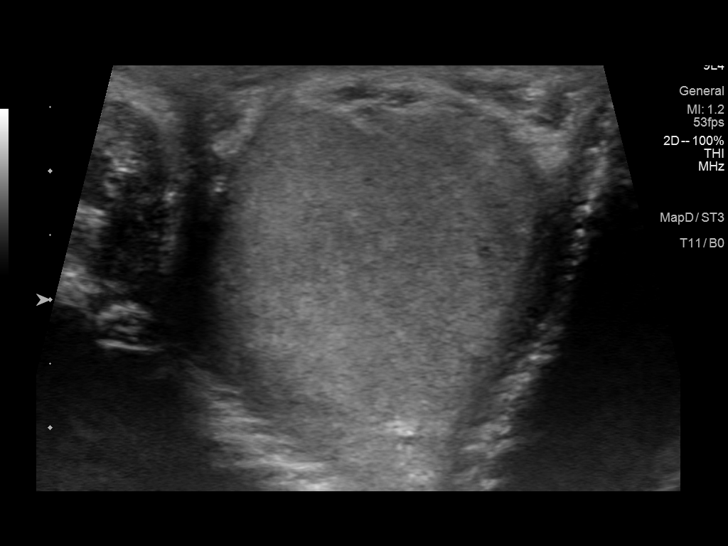
[im 35/60]
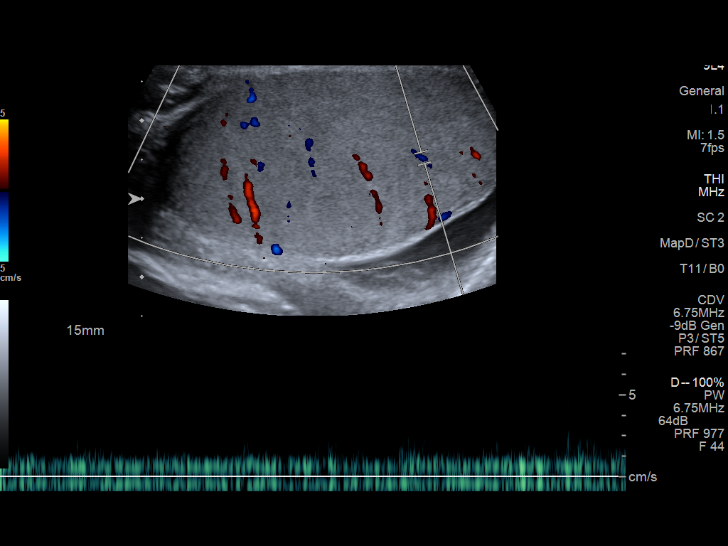
[im 40/60]
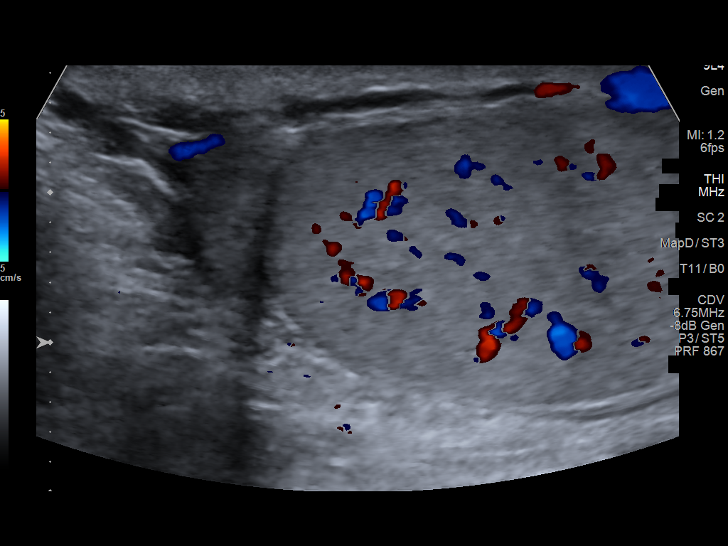
[im 45/60]
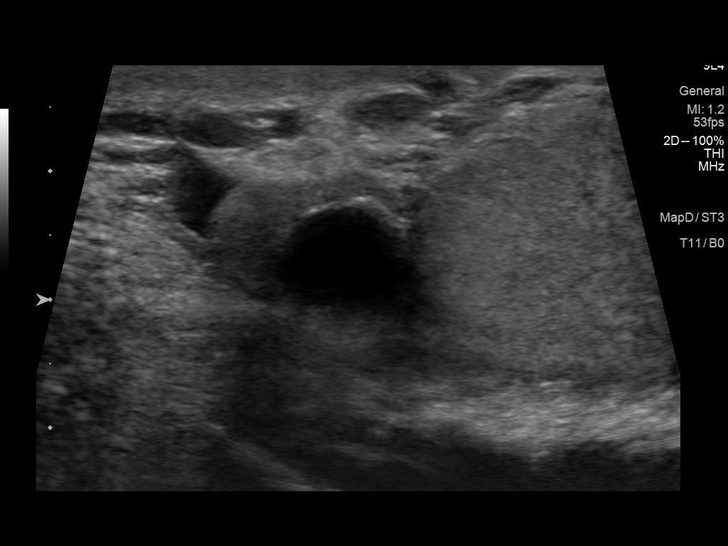
[im 50/60]
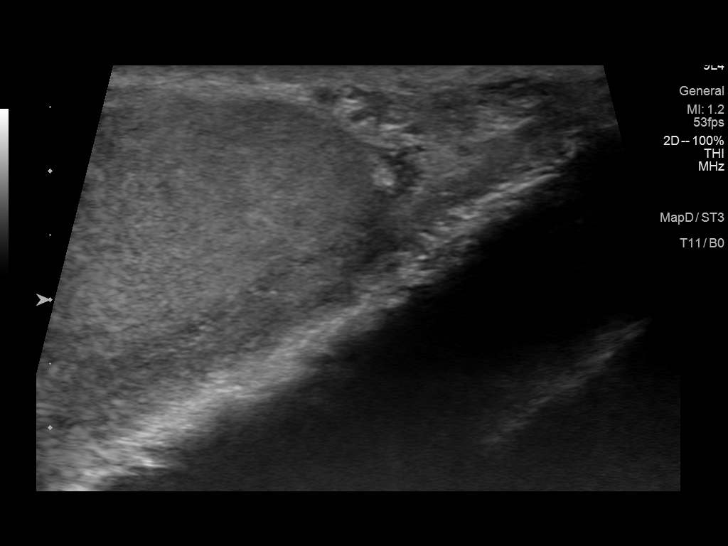
[im 55/60]
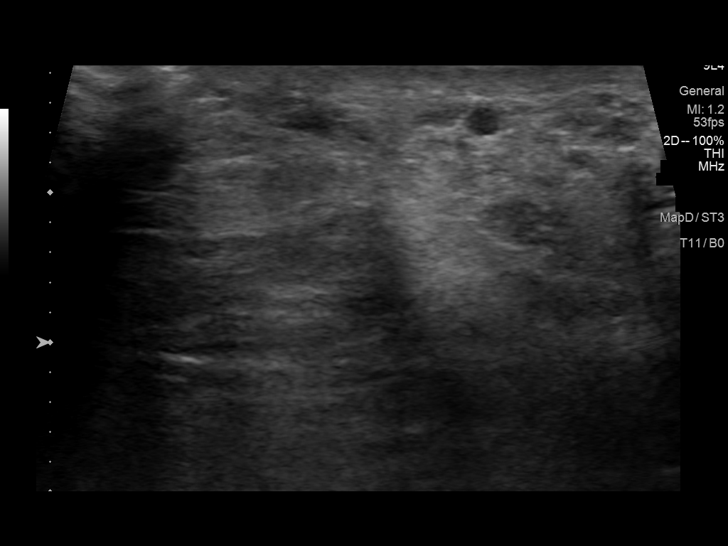
[im 60/60]
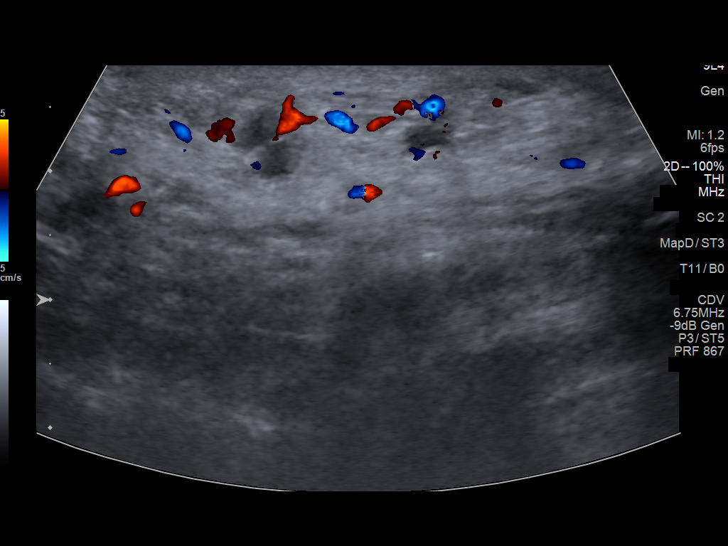

[13 of 25 positions shown; findings below may reference images not displayed]

FINDINGS: Right testicle

Measurements: 4.0 x 2.1 x 2.5 cm. Normal echogenicity without mass
or calcification. Internal blood flow present on color Doppler
imaging.

Left testicle

Measurements: 4.1 x 2.4 x 2.5 cm. Normal echogenicity without mass
or calcification. Internal blood flow present on color Doppler
imaging.

Right epididymis:  Normal in size and appearance.

Left epididymis: Cyst at LEFT epididymal head 11 x 9 x 13 mm
question epididymal cyst versus spermatocele. No additional masses.

Hydrocele:  Trace LEFT hydrocele.  No RIGHT hydrocele.

Varicocele:  None visualized.

Pulsed Doppler interrogation of both testes demonstrates normal low
resistance arterial and venous waveforms bilaterally.
IMPRESSION: Cyst at LEFT epididymal head 13 mm greatest size question epididymal
cyst versus spermatocele.

Trace LEFT hydrocele.

Otherwise normal exam.

## 2021-07-24 ENCOUNTER — Ambulatory Visit
Admission: RE | Admit: 2021-07-24 | Discharge: 2021-07-24 | Disposition: A | Discharge status: Home / Self Care | Payer: BC Managed Care – PPO | Source: Ambulatory Visit | Attending: Family Medicine | Admitting: Family Medicine

## 2021-07-24 ENCOUNTER — Other Ambulatory Visit: Payer: Self-pay

## 2021-07-24 ENCOUNTER — Other Ambulatory Visit: Payer: Self-pay | Admitting: Family Medicine

## 2021-07-24 DIAGNOSIS — M25552 Pain in left hip: Secondary | ICD-10-CM

## 2021-07-30 LAB — EXTERNAL GENERIC LAB PROCEDURE: COLOGUARD: NEGATIVE

## 2021-07-30 LAB — COLOGUARD: COLOGUARD: NEGATIVE

## 2021-12-28 ENCOUNTER — Ambulatory Visit (INDEPENDENT_AMBULATORY_CARE_PROVIDER_SITE_OTHER): Payer: BC Managed Care – PPO

## 2021-12-28 ENCOUNTER — Other Ambulatory Visit: Payer: Self-pay

## 2021-12-28 ENCOUNTER — Encounter: Payer: Self-pay | Admitting: Podiatry

## 2021-12-28 ENCOUNTER — Ambulatory Visit (INDEPENDENT_AMBULATORY_CARE_PROVIDER_SITE_OTHER): Payer: BC Managed Care – PPO | Admitting: Podiatry

## 2021-12-28 DIAGNOSIS — M2042 Other hammer toe(s) (acquired), left foot: Secondary | ICD-10-CM

## 2021-12-28 DIAGNOSIS — M7741 Metatarsalgia, right foot: Secondary | ICD-10-CM

## 2021-12-28 DIAGNOSIS — M19071 Primary osteoarthritis, right ankle and foot: Secondary | ICD-10-CM

## 2021-12-28 DIAGNOSIS — M2041 Other hammer toe(s) (acquired), right foot: Secondary | ICD-10-CM | POA: Diagnosis not present

## 2021-12-28 DIAGNOSIS — R1032 Left lower quadrant pain: Secondary | ICD-10-CM | POA: Insufficient documentation

## 2021-12-28 DIAGNOSIS — M7742 Metatarsalgia, left foot: Secondary | ICD-10-CM

## 2021-12-28 DIAGNOSIS — M549 Dorsalgia, unspecified: Secondary | ICD-10-CM | POA: Insufficient documentation

## 2021-12-28 DIAGNOSIS — M2022 Hallux rigidus, left foot: Secondary | ICD-10-CM

## 2021-12-28 DIAGNOSIS — M2021 Hallux rigidus, right foot: Secondary | ICD-10-CM

## 2021-12-28 DIAGNOSIS — M19072 Primary osteoarthritis, left ankle and foot: Secondary | ICD-10-CM

## 2021-12-31 ENCOUNTER — Encounter: Payer: Self-pay | Admitting: Podiatry

## 2021-12-31 NOTE — Progress Notes (Signed)
?  Subjective:  ?Patient ID: Phillip Griffin, male    DOB: 03-04-1976,  MRN: [id number] ? ?Chief Complaint  ?Patient presents with  ? Hammer Toe  ?  Hammer toes Referring Provider: Hayden Rasmussen  ? ? ?46 y.o. male presents with the above complaint. History confirmed with patient.  His toes are starting to rub in his shoes, he has bumps over the great toes and pain in the joint ? ?Objective:  ?Physical Exam: ?warm, good capillary refill, no trophic changes or ulcerative lesions, normal DP and PT pulses, normal sensory exam, and semirigid hammertoe deformities 2 through 5 bilateral, he has hallux rigidus with right worse than left with minimal range of motion and pain and grinding ? 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 ?1. Hammertoes of both feet   ?2. Hallux rigidus of both feet   ?3. 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 Metatarsalgia of both feet   ? ? ? ?Plan:  ?Patient was evaluated and treated and all questions answered. ? ?Reviewed the radiographs with him and discussed treatment options including wider supportive shoe gear with a soft toe box.  Discussed that he has severe osteoarthritis in the great toe joints bilateral.  We discussed surgical and nonsurgical treatment including arthrodesis of the great toe joint which is what I would recommend if he ever considers will need surgical intervention.  We discussed nonsurgical treatment including anti-inflammatories and an orthotic for the shoe, I think he needs a fairly rigid orthotic with a Morton's extension to support the great toe joint.  We discussed injections of the joint if it worsens.  He will return as needed if it does get worse, he will be seen by the orthotist for his orthotics ? ?No follow-ups on file.  ? ?

## 2022-01-01 ENCOUNTER — Other Ambulatory Visit: Payer: Self-pay

## 2022-01-01 ENCOUNTER — Ambulatory Visit (INDEPENDENT_AMBULATORY_CARE_PROVIDER_SITE_OTHER): Payer: BC Managed Care – PPO

## 2022-01-01 DIAGNOSIS — M2042 Other hammer toe(s) (acquired), left foot: Secondary | ICD-10-CM

## 2022-01-01 DIAGNOSIS — M7742 Metatarsalgia, left foot: Secondary | ICD-10-CM

## 2022-01-01 DIAGNOSIS — M2041 Other hammer toe(s) (acquired), right foot: Secondary | ICD-10-CM | POA: Diagnosis not present

## 2022-01-01 DIAGNOSIS — M7741 Metatarsalgia, right foot: Secondary | ICD-10-CM | POA: Diagnosis not present

## 2022-01-01 DIAGNOSIS — M2021 Hallux rigidus, right foot: Secondary | ICD-10-CM

## 2022-01-01 DIAGNOSIS — M2022 Hallux rigidus, left foot: Secondary | ICD-10-CM

## 2022-01-01 NOTE — Progress Notes (Signed)
SITUATION ?Reason for Consult: Evaluation for Bilateral Custom Foot Orthoses ?Patient / Caregiver Report: Patient is ready for foot orthotics ? ?OBJECTIVE DATA: ?Patient History / Diagnosis:  ?  ICD-10-CM   ?1. Hammertoes of both feet  M20.41   ? M20.42   ?  ?2. Hallux rigidus of both feet  M20.21   ? M20.22   ?  ?3. Metatarsalgia of both feet  M77.41   ? M77.42   ?  ? ? ?Current or Previous Devices:   None and no history ? ?Foot Examination: ?Skin presentation:   Intact ?Ulcers & Callousing:   None ?Toe / Foot Deformities:  Prominent metatarsals ?Weight Bearing Presentation:  Rectus ?Sensation:    Intact ? ?Shoe Size:    12 ? ?ORTHOTIC RECOMMENDATION ?Recommended Device: 1x pair of custom functional foot orthotics ? ?GOALS OF ORTHOSES ?- Reduce Pain ?- Prevent Foot Deformity ?- Prevent Progression of Further Foot Deformity ?- Relieve Pressure ?- Improve the Overall Biomechanical Function of the Foot and Lower Extremity. ? ?ACTIONS PERFORMED ?Potential out of pocket cost was communicated to patient. Patient understood and consent to casting. Patient was casted for Foot Orthoses via crush box. Procedure was explained and patient tolerated procedure well. Casts were shipped to central fabrication. All questions were answered and concerns addressed. ? ?PLAN ?Patient is to be called for fitting when devices are ready.  ? ? ?

## 2022-02-01 ENCOUNTER — Telehealth: Payer: Self-pay

## 2022-02-01 NOTE — Telephone Encounter (Signed)
Foot orthotics are ready for fitting. Left message for patient to call and schedule appointment. ?

## 2022-02-06 ENCOUNTER — Ambulatory Visit: Payer: BC Managed Care – PPO

## 2022-02-06 DIAGNOSIS — M2041 Other hammer toe(s) (acquired), right foot: Secondary | ICD-10-CM

## 2022-02-06 DIAGNOSIS — M19071 Primary osteoarthritis, right ankle and foot: Secondary | ICD-10-CM

## 2022-02-06 DIAGNOSIS — M7741 Metatarsalgia, right foot: Secondary | ICD-10-CM

## 2022-02-06 DIAGNOSIS — M2021 Hallux rigidus, right foot: Secondary | ICD-10-CM

## 2022-02-06 NOTE — Progress Notes (Signed)
SITUATION: ?Reason for Visit: Fitting and Delivery of Custom Fabricated Foot Orthoses ?Patient Report: Patient reports comfort and is satisfied with device. ? ?OBJECTIVE DATA: ?Patient History / Diagnosis:   ?  ICD-10-CM   ?1. Hammertoes of both feet  M20.41   ? M20.42   ?  ?2. Hallux rigidus of both feet  M20.21   ? M20.22   ?  ?3. Metatarsalgia of both feet  M77.41   ? M77.42   ?  ?4. 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 E56.314   ?  ? ? ?Provided Device:  Custom Functional Foot Orthotics ?    [employer]: [id number] ? ?GOAL OF ORTHOSIS ?- Improve gait ?- Decrease energy expenditure ?- Improve Balance ?- Provide Triplanar stability of foot complex ?- Facilitate motion ? ?ACTIONS PERFORMED ?Patient was fit with foot orthotics trimmed to shoe last. Patient tolerated fittign procedure.  ? ?Patient was provided with verbal and written instruction and demonstration regarding donning, doffing, wear, care, proper fit, function, purpose, cleaning, and use of the orthosis and in all related precautions and risks and benefits regarding the orthosis. ? ?Patient was also provided with verbal instruction regarding how to report any failures or malfunctions of the orthosis and necessary follow up care. Patient was also instructed to contact our office regarding any change in status that may affect the function of the orthosis. ? ?Patient demonstrated independence with proper donning, doffing, and fit and verbalized understanding of all instructions. ? ?PLAN: ?Patient is to follow up in one week or as necessary (PRN). All questions were answered and concerns addressed. Plan of care was discussed with and agreed upon by the patient. ? ?

## 2022-06-19 ENCOUNTER — Ambulatory Visit: Payer: BC Managed Care – PPO | Admitting: Physician Assistant

## 2024-01-26 ENCOUNTER — Encounter (HOSPITAL_COMMUNITY): Payer: Self-pay

## 2024-01-26 ENCOUNTER — Other Ambulatory Visit: Payer: Self-pay

## 2024-01-26 ENCOUNTER — Emergency Department (HOSPITAL_COMMUNITY)
Admission: EM | Admit: 2024-01-26 | Discharge: 2024-01-26 | Disposition: A | Discharge status: Home / Self Care | Attending: Emergency Medicine | Admitting: Emergency Medicine

## 2024-01-26 ENCOUNTER — Emergency Department (HOSPITAL_COMMUNITY)

## 2024-01-26 DIAGNOSIS — R1032 Left lower quadrant pain: Secondary | ICD-10-CM | POA: Diagnosis not present

## 2024-01-26 DIAGNOSIS — R109 Unspecified abdominal pain: Secondary | ICD-10-CM | POA: Diagnosis present

## 2024-01-26 LAB — CBC
HCT: 42.8 % (ref 39.0–52.0)
Hemoglobin: 14.5 g/dL (ref 13.0–17.0)
MCH: 30.6 pg (ref 26.0–34.0)
MCHC: 33.9 g/dL (ref 30.0–36.0)
MCV: 90.3 fL (ref 80.0–100.0)
Platelets: 162 10*3/uL (ref 150–400)
RBC: 4.74 MIL/uL (ref 4.22–5.81)
RDW: 12.2 % (ref 11.5–15.5)
WBC: 5.6 10*3/uL (ref 4.0–10.5)
nRBC: 0 % (ref 0.0–0.2)

## 2024-01-26 LAB — COMPREHENSIVE METABOLIC PANEL WITH GFR
ALT: 44 U/L (ref 0–44)
AST: 40 U/L (ref 15–41)
Albumin: 3.3 g/dL — ABNORMAL LOW (ref 3.5–5.0)
Alkaline Phosphatase: 44 U/L (ref 38–126)
Anion gap: 9 (ref 5–15)
BUN: 14 mg/dL (ref 6–20)
CO2: 25 mmol/L (ref 22–32)
Calcium: 8.1 mg/dL — ABNORMAL LOW (ref 8.9–10.3)
Chloride: 104 mmol/L (ref 98–111)
Creatinine, Ser: 0.88 mg/dL (ref 0.61–1.24)
GFR, Estimated: >60 mL/min (ref >60)
Glucose, Bld: 106 mg/dL — ABNORMAL HIGH (ref 70–99)
Potassium: 4.2 mmol/L (ref 3.5–5.1)
Sodium: 138 mmol/L (ref 135–145)
Total Bilirubin: 1 mg/dL (ref 0.0–1.2)
Total Protein: 6 g/dL — ABNORMAL LOW (ref 6.5–8.1)

## 2024-01-26 LAB — URINALYSIS, ROUTINE W REFLEX MICROSCOPIC
Bilirubin Urine: NEGATIVE
Glucose, UA: NEGATIVE mg/dL
Hgb urine dipstick: NEGATIVE
Ketones, ur: NEGATIVE mg/dL
Leukocytes,Ua: NEGATIVE
Nitrite: NEGATIVE
Protein, ur: NEGATIVE mg/dL
Specific Gravity, Urine: 1.023 (ref 1.005–1.030)
pH: 5 (ref 5.0–8.0)

## 2024-01-26 MED ORDER — ONDANSETRON HCL 4 MG/2ML IJ SOLN
4.0000 mg | Freq: Once | INTRAMUSCULAR | Status: AC
  Administered 2024-01-26: 4 mg via INTRAVENOUS
  Filled 2024-01-26: qty 2

## 2024-01-26 MED ORDER — SODIUM CHLORIDE 0.9 % IV BOLUS
1000.0000 mL | Freq: Once | INTRAVENOUS | Status: AC
  Administered 2024-01-26: 1000 mL via INTRAVENOUS

## 2024-01-26 MED ORDER — ONDANSETRON HCL 4 MG PO TABS: 4.0000 mg | ORAL_TABLET | Freq: Four times a day (QID) | ORAL | 0 refills | Status: AC

## 2024-01-26 MED ORDER — KETOROLAC TROMETHAMINE 15 MG/ML IJ SOLN
15.0000 mg | Freq: Once | INTRAMUSCULAR | Status: AC
  Administered 2024-01-26: 15 mg via INTRAVENOUS
  Filled 2024-01-26: qty 1

## 2024-01-26 MED ORDER — IOHEXOL 350 MG/ML SOLN
75.0000 mL | Freq: Once | INTRAVENOUS | Status: AC | PRN
  Administered 2024-01-26: 75 mL via INTRAVENOUS

## 2024-01-26 MED ORDER — DICYCLOMINE HCL 20 MG PO TABS: 20.0000 mg | ORAL_TABLET | Freq: Two times a day (BID) | ORAL | 0 refills | Status: AC

## 2024-01-26 MED ORDER — HYDROMORPHONE HCL 1 MG/ML IJ SOLN
0.5000 mg | Freq: Once | INTRAMUSCULAR | Status: AC
  Administered 2024-01-26: 0.5 mg via INTRAVENOUS
  Filled 2024-01-26: qty 1

## 2024-01-26 NOTE — Discharge Instructions (Addendum)
 It was a pleasure taking part in your care.  As we discussed, your workup is reassuring.  You have no blood in your urine.  Your lab results show no elevated white count to indicate that your body is fighting an infection.  Your electrolytes are all within normal range.  CT scan of your abdomen showed no acute findings other than a small periumbilical abdominal defect containing omental fat however this is not the location of your pain and should not be causing the symptoms.  Please follow-up with your PCP.  Please begin taking Zofran  every 6 hours as needed for nausea.  Please begin taking Bentyl  twice a day as needed for abdominal pain.  Please return to the ED with any new or worsening symptoms.

## 2024-01-26 NOTE — ED Triage Notes (Signed)
 PT arrived POV from home c/o left flank pain that radiates to his back. Pt also had an episode of N/V/D this morning.

## 2024-01-26 NOTE — ED Provider Notes (Signed)
 [facility] Provider Note   CSN: [id number] Arrival date & time: 01/26/24  3086     History  Chief Complaint  Patient presents with   Flank Pain    Phillip Griffin is a 48 y.o. male with medical history of OCD and depression.  The patient presents to the ED for evaluation of bilateral flank pain, left worse than right.  Patient reports that his symptoms began last night and into this morning.  He reports that he traveled from [place] yesterday.  States that while traveling yesterday began to feel "uncomfortable" generally speaking in his abdomen.  He denies any kind of specific focality of his "uncomfortable" sensation.  He reports he just felt "off".  States that he was woken up multiple times throughout the night with left greater than right flank pain.  States that the flank pain on the left is now radiating into his left lower quadrant.  Reports that at 1 point throughout the night he had nausea and vomiting.  He endorses nausea at this time.  He also states he had 2 episodes of diarrhea which were nonbloody.  He denies any dysuria.  He reports that he has had "dribbling" throughout the night.  States that he attempted to use the bathroom but is unable to get much urine out.  Denies any fevers at home.  Denies any testicle pain.   Flank Pain       Home Medications Prior to Admission medications   Medication Sig Start Date End Date Taking? Authorizing Provider  dicyclomine  (BENTYL ) 20 MG tablet Take 1 tablet (20 mg total) by mouth 2 (two) times daily. 01/26/24  Yes Adel Aden, PA-C  ondansetron  (ZOFRAN ) 4 MG tablet Take 1 tablet (4 mg total) by mouth every 6 (six) hours. 01/26/24  Yes Adel Aden, PA-C  amoxicillin (AMOXIL) 875 MG tablet Take 875 mg by mouth 2 (two) times daily. 12/27/21   [provider]  cetirizine (ZYRTEC) 10 MG chewable tablet Chew 10 mg by mouth daily.    [provider]   chlorhexidine (PERIDEX) 0.12 % solution SMARTSIG:By Mouth 12/27/21   [provider]  escitalopram (LEXAPRO) 20 MG tablet Take 60 mg by mouth daily.    [provider]  ibuprofen (ADVIL,MOTRIN) 200 MG tablet Take 200 mg by mouth every 6 (six) hours as needed for mild pain.    [provider]  LORazepam (ATIVAN) 0.5 MG tablet Take 0.5 mg by mouth every 8 (eight) hours as needed for anxiety.    [provider]  meloxicam (MOBIC) 15 MG tablet Take 15 mg by mouth daily. 08/24/20   [provider]  methylPREDNISolone (MEDROL) 4 MG tablet as directed po once for 6 days 05/13/19   [provider]  Multiple Vitamin (MULTIVITAMIN WITH MINERALS) TABS tablet Take 1 tablet by mouth daily.    [provider]  ondansetron  (ZOFRAN  ODT) 8 MG disintegrating tablet 8mg  ODT q4 hours prn nausea Patient not taking: Reported on 09/05/2020 01/14/14   Dammen, Peter, PA-C      Allergies    Ziprasidone    Review of Systems   Review of Systems  Genitourinary:  Positive for flank pain.    Physical Exam Updated Vital Signs BP 120/78 (BP Location: Left Arm)   Pulse 86   Temp 97.9 F (36.6 C) (Oral)   Resp 18   Ht 6' (1.829 m)   Wt 95.3 kg   SpO2 98%  BMI 28.48 kg/m  Physical Exam Vitals and nursing note reviewed.  Constitutional:      General: He is not in acute distress.    Appearance: He is well-developed.  HENT:     Head: Normocephalic and atraumatic.  Eyes:     Conjunctiva/sclera: Conjunctivae normal.  Cardiovascular:     Rate and Rhythm: Normal rate and regular rhythm.     Heart sounds: No murmur heard. Pulmonary:     Effort: Pulmonary effort is normal. No respiratory distress.     Breath sounds: Normal breath sounds.  Abdominal:     Palpations: Abdomen is soft.     Tenderness: There is abdominal tenderness. There is no right CVA tenderness or left CVA tenderness.     Comments: LLQ TTP  Musculoskeletal:        General: No  swelling.     Cervical back: Neck supple.  Skin:    General: Skin is warm and dry.     Capillary Refill: Capillary refill takes less than 2 seconds.  Neurological:     Mental Status: He is alert and oriented to person, place, and time. Mental status is at baseline.  Psychiatric:        Mood and Affect: Mood normal.     ED Results / Procedures / Treatments   Labs (all labs ordered are listed, but only abnormal results are displayed) Labs Reviewed  COMPREHENSIVE METABOLIC PANEL WITH GFR - Abnormal; Notable for the following components:      Result Value   Glucose, Bld 106 (*)    Calcium 8.1 (*)    Total Protein 6.0 (*)    Albumin 3.3 (*)    All other components within normal limits  URINALYSIS, ROUTINE W REFLEX MICROSCOPIC  CBC    EKG None  Radiology CT ABDOMEN PELVIS W CONTRAST Result Date: 01/26/2024 CLINICAL DATA:  Abdominal pain. EXAM: CT ABDOMEN AND PELVIS WITH CONTRAST TECHNIQUE: Multidetector CT imaging of the abdomen and pelvis was performed using the standard protocol following bolus administration of intravenous contrast. RADIATION DOSE REDUCTION: This exam was performed according to the departmental dose-optimization program which includes automated exposure control, adjustment of the mA and/or kV according to patient size and/or use of iterative reconstruction technique. CONTRAST:  75mL OMNIPAQUE  IOHEXOL  350 MG/ML SOLN COMPARISON:  05/06/2019. FINDINGS: Lower chest: Dependent basilar subsegmental atelectasis or scarring. No pleural or pericardial effusions. Hepatobiliary: No focal liver abnormality is seen. No gallstones, gallbladder wall thickening, or biliary dilatation. Pancreas: Unremarkable. No pancreatic ductal dilatation or surrounding inflammatory changes. Spleen: Normal in size without focal abnormality. Adrenals/Urinary Tract: Adrenal glands are unremarkable. Kidneys are normal, without renal calculi, focal lesion, or hydronephrosis. Bladder is unremarkable.  Stomach/Bowel: Stomach is within normal limits. Appendix appears normal. No evidence of bowel wall thickening, distention, or inflammatory changes. Diverticula in the sigmoid. Vascular/Lymphatic: No significant vascular findings are present. No enlarged abdominal or pelvic lymph nodes. Reproductive: Prostate is unremarkable. Other: Tiny periumbilical abdominal defect that contains omental fat without evidence of incarceration or herniated bowel. No abdominopelvic ascites. Musculoskeletal: No acute or significant osseous findings. IMPRESSION: 1. Tiny periumbilical abdominal defect containing omental fat. 2. Diverticulosis. 3. No acute abdominal or pelvic pathology identified. Electronically Signed   By: Sydell Eva M.D.   On: 01/26/2024 10:41    Procedures Procedures   Medications Ordered in ED Medications  ondansetron  (ZOFRAN ) injection 4 mg (4 mg Intravenous Given 01/26/24 0824)  sodium chloride  0.9 % bolus 1,000 mL (0 mLs Intravenous Stopped 01/26/24 1104)  ketorolac  (TORADOL ) 15 MG/ML injection 15 mg (15 mg Intravenous Given 01/26/24 0824)  HYDROmorphone  (DILAUDID ) injection 0.5 mg (0.5 mg Intravenous Given 01/26/24 0824)  iohexol  (OMNIPAQUE ) 350 MG/ML injection 75 mL (75 mLs Intravenous Contrast Given 01/26/24 1029)    ED Course/ Medical Decision Making/ A&P  Medical Decision Making Amount and/or Complexity of Data Reviewed Labs: ordered. Radiology: ordered.  Risk Prescription drug management.   48 year old male presents for evaluation.  Please see HPI for further details.  On examination patient is afebrile and nontachycardic.  His lung sounds are clear bilaterally, he is nonhypoxic.  Abdomen soft and compressible with tenderness in the left lower quadrant, no CVA tenderness bilaterally.  Neurological examinations at baseline.  Will assess patient utilizing CBC, CMP, urinalysis and CT scan of abdomen.  Will provide patient with Toradol , Dilaudid , Zofran  and 1 L of fluid.  Patient  CBC without leukocytosis or anemia.  Metabolic panel without electrolyte derangement, no elevated LFTs, anion gap 9.  Urinalysis negative for all, no hemoglobin.  At this time, patient reports feeling better with medications.  Initially thought patient could be experiencing nephrolithiasis however this is not confirmed on CT scan.  No evidence of leukocytes, nitrates or blood in urine.  He has been having persistent diarrhea here in the ED.  Patient could be suffering from viral GI bug.  Reports he just recently visited family in [place].  Attempted to collect stool sample from patient however patient reports he is unable to provide at this time.  Will discharge patient home at this time with Zofran , Bentyl .  Will have him follow-up with his PCP.  Have given return precautions and he voiced understanding.  Stable to discharge.   Final Clinical Impression(s) / ED Diagnoses Final diagnoses:  Left flank pain  Right flank pain  Left lower quadrant abdominal pain    Rx / DC Orders ED Discharge Orders          Ordered    dicyclomine  (BENTYL ) 20 MG tablet  2 times daily        01/26/24 1148    ondansetron  (ZOFRAN ) 4 MG tablet  Every 6 hours        01/26/24 1148              Numan, Zylstra, PA-C 01/26/24 1152    Long, Joshua G, MD 02/04/24 1729

## 2024-07-16 ENCOUNTER — Other Ambulatory Visit (HOSPITAL_BASED_OUTPATIENT_CLINIC_OR_DEPARTMENT_OTHER): Payer: Self-pay | Admitting: *Deleted

## 2024-07-16 DIAGNOSIS — R079 Chest pain, unspecified: Secondary | ICD-10-CM

## 2024-08-18 ENCOUNTER — Other Ambulatory Visit (HOSPITAL_BASED_OUTPATIENT_CLINIC_OR_DEPARTMENT_OTHER)

## 2024-09-11 ENCOUNTER — Other Ambulatory Visit (INDEPENDENT_AMBULATORY_CARE_PROVIDER_SITE_OTHER)

## 2024-09-11 DIAGNOSIS — R079 Chest pain, unspecified: Secondary | ICD-10-CM

## 2024-09-11 HISTORY — PX: TRANSTHORACIC ECHOCARDIOGRAM: SHX275

## 2024-09-11 LAB — ECHOCARDIOGRAM COMPLETE
AR max vel: 2.34 cm2
AV Area VTI: 2.59 cm2
AV Area mean vel: 2.53 cm2
AV Mean grad: 3 mmHg
AV Peak grad: 7.5 mmHg
Ao pk vel: 1.37 m/s
Area-P 1/2: 2.99 cm2
S' Lateral: 2.76 cm

## 2024-09-16 LAB — COLOGUARD: COLOGUARD: NEGATIVE

## 2024-10-17 NOTE — Progress Notes (Signed)
 " Cardiology Office Note:  .   Date:  10/23/2024  ID:  Phillip Griffin, DOB 1976/09/16, MRN [id number] PCP: Burney Darice CROME, MD  [facility] Providers Cardiologist:  Alm Clay, MD     Chief Complaint  Patient presents with   New Patient (Initial Visit)    Abnormal echocardiogram ordered for an episode of chest pain    Patient Profile: .     Phillip Griffin is a borderline obese 49 y.o. male with a PMH notable for significant anxiety, OSA and Elevated Blood Pressure Reading who presents here for Chest/PrecordialPain Evaluation at the request of Burney Darice CROME, MD.  PMH: OSA MDD Back pain with sciatica (spondylolysis Allergies   Phillip Griffin was seen on 09/11/2024 by Dr. Burney for evaluation of chest pain -describes atypical chest pain.  An echocardiogram was ordered that was relatively normal but revealed mild right atrial and left atrial dilation and therefore was referred for cardiac evaluation.    Subjective  Discussed the use of AI scribe software for clinical note transcription with the patient, who gave verbal consent to proceed.  History of Present Illness Phillip Griffin is a 49 year old male who presents with palpitations and a history of chest pain. He was referred by his GP following an echocardiogram that showed abnormalities.  In the fall, he began experiencing chest pains, initially attributed to anxiety. He was sent for an echocardiogram after reporting chest pain to his general practitioner. He no longer experiences chest pain since starting guanfacine for anxiety, which has reduced his anxiety to a level that does not cause chest pain.  He experiences two types of palpitations: 'flutters' and a 'pause, punch' sensation. The 'pause, punch' is characterized by a pause followed by a hard beat, while the 'flutters' are quick and transient. These occur variably, sometimes a couple of times a week or month, without specific  triggers. Attempts to capture these episodes using his watch were inconclusive.  His past medical history includes an episode 14 years ago with chest pain at work, leading to an EKG, nitroglycerin administration, and a nuclear stress test, which was normal except for reduced circulation in his lower legs. He has a family history of atrial fibrillation, as his brother, who is 17 years older, has the condition.  He is currently taking guanfacine for anxiety, which has helped reduce his symptoms. He is generally stressed and anxious, which he believes contributes to his symptoms. He reports no chest pain with exertion, only at rest.   Objective   Medications: Lexapro 20 mg daily; guanfacine 1 mg daily (for anxiety) PRN Xyzal 5 mg daily; as needed Ativan 0.5 mg  Studies Reviewed: SABRA   EKG Interpretation Date/Time:  Monday October 19 2024 08:28:20 EST Ventricular Rate:  80 PR Interval:  158 QRS Duration:  92 QT Interval:  360 QTC Calculation: 415 R Axis:   42  Text Interpretation: Normal sinus rhythm Normal ECG No previous ECGs available Confirmed by Clay Alm ([id number]) on 10/19/2024 8:54:03 AM    Results Diagnostic Echocardiogram (Fall 2025): Ejection fraction 60/70, upper limit of normal; no regional wall motion abnormalities; normal systolic and diastolic function; mitral and aortic valves structurally normal; mildly dilated left and right atria; right and left atrial pressures within normal limits; no additional abnormalities.    Risk Assessment/Calculations:         Physical Exam:   VS:  BP 122/78 (BP Location: Right Arm, Patient Position: Sitting, Cuff Size: Normal)   Pulse 80  Ht 6' (1.829 m)   Wt 222 lb (100.7 kg)   SpO2 96%   BMI 30.11 kg/m    Wt Readings from Last 3 Encounters:  10/19/24 222 lb (100.7 kg)  01/26/24 210 lb (95.3 kg)  09/05/20 207 lb (93.9 kg)      GEN: Well nourished, well groomed; in no acute distress; mildly obese but otherwise healthy  appearing NECK: No JVD; No carotid bruits CARDIAC: Normal S1, S2; RRR, no murmurs, rubs, gallops RESPIRATORY:  Clear to auscultation without rales, wheezing or rhonchi ; nonlabored, good air movement. ABDOMEN: Soft, non-tender, non-distended EXTREMITIES:  No edema; No deformity    ASSESSMENT AND PLAN: .   Palpitations Likely benign premature atrial or ventricular contractions. Infrequent, not associated with significant symptoms. No atrial fibrillation or arrhythmias on EKG. Stress, anxiety, and lifestyle factors may contribute. - Monitor symptoms. - Consider cardiac monitor if palpitations increase in frequency or become symptomatic.  Chest pain with low risk for cardiac etiology Chest pain of uncertain etiology Intermittent chest pain at rest, likely anxiety-related.  Echocardiogram normal with mild atrial dilation due to body size. No significant valvular disease. Differential includes anxiety-related versus cardiac etiology. Not really having significant chest discomfort at this point in time.  Much relieved after starting guanfacine.  Concerned that the symptoms are probably related to stress and anxiety. Given the absence of active symptoms, no need for ischemic evaluation at this point.  - Continue guanfacine for anxiety management. - Consider treadmill stress test if pain worsens with exertion.  Right atrial dilation Echo suggest mild biatrial dilation, but visually the atria look relatively normal for his size.  No other findings on the echocardiogram to explain dilated atria.  In the absence of significant hypertension or valvular disease, that not much to make sense of this.  No evidence of cor triatriatum. I explained that really the only thing that mild dilation could potentially be related to would be potential risk for developing A-fib in the future.  Would simply monitor blood pressures.  But I think this is a relatively benign finding and would not be overly concerned  and would not do further evaluation.   Orders Placed This Encounter  Procedures   EKG 12-Lead          Follow-Up: Return in about 1 year (around 10/19/2025) for [employer].     Signed, Alm MICAEL Clay, MD, MS Alm Clay, M.D., M.S. Interventional Cardiologist  [facility] Pager # [phone]      "

## 2024-10-19 ENCOUNTER — Ambulatory Visit: Attending: Cardiology | Admitting: Cardiology

## 2024-10-19 VITALS — BP 122/78 | HR 80 | Ht 72.0 in | Wt 222.0 lb

## 2024-10-19 DIAGNOSIS — F411 Generalized anxiety disorder: Secondary | ICD-10-CM | POA: Diagnosis not present

## 2024-10-19 DIAGNOSIS — R079 Chest pain, unspecified: Secondary | ICD-10-CM

## 2024-10-19 DIAGNOSIS — R002 Palpitations: Secondary | ICD-10-CM | POA: Insufficient documentation

## 2024-10-19 DIAGNOSIS — I517 Cardiomegaly: Secondary | ICD-10-CM

## 2024-10-19 NOTE — Patient Instructions (Signed)

## 2024-10-23 ENCOUNTER — Encounter: Payer: Self-pay | Admitting: Cardiology

## 2024-10-23 DIAGNOSIS — R079 Chest pain, unspecified: Secondary | ICD-10-CM | POA: Insufficient documentation

## 2024-10-23 DIAGNOSIS — I517 Cardiomegaly: Secondary | ICD-10-CM | POA: Insufficient documentation

## 2024-10-23 NOTE — Assessment & Plan Note (Signed)
 Echo suggest mild biatrial dilation, but visually the atria look relatively normal for his size.  No other findings on the echocardiogram to explain dilated atria.  In the absence of significant hypertension or valvular disease, that not much to make sense of this.  No evidence of cor triatriatum. I explained that really the only thing that mild dilation could potentially be related to would be potential risk for developing A-fib in the future.  Would simply monitor blood pressures.  But I think this is a relatively benign finding and would not be overly concerned and would not do further evaluation.

## 2024-10-23 NOTE — Assessment & Plan Note (Addendum)
 Chest pain of uncertain etiology Intermittent chest pain at rest, likely anxiety-related.  Echocardiogram normal with mild atrial dilation due to body size. No significant valvular disease. Differential includes anxiety-related versus cardiac etiology. Not really having significant chest discomfort at this point in time.  Much relieved after starting guanfacine.  Concerned that the symptoms are probably related to stress and anxiety. Given the absence of active symptoms, no need for ischemic evaluation at this point.  - Continue guanfacine for anxiety management. - Consider treadmill stress test if pain worsens with exertion.

## 2024-10-23 NOTE — Assessment & Plan Note (Signed)
 Likely benign premature atrial or ventricular contractions. Infrequent, not associated with significant symptoms. No atrial fibrillation or arrhythmias on EKG. Stress, anxiety, and lifestyle factors may contribute. - Monitor symptoms. - Consider cardiac monitor if palpitations increase in frequency or become symptomatic.
